# Patient Record
Sex: Male | Born: 1935 | Race: White | Hispanic: No | Marital: Married | Smoking: Former smoker
Health system: Southern US, Community
[De-identification: ages and names within clinical notes are randomized; demographics above are authoritative.]

## PROBLEM LIST (undated history)

## (undated) DIAGNOSIS — E7800 Pure hypercholesterolemia, unspecified: Secondary | ICD-10-CM

## (undated) HISTORY — PX: BUNIONECTOMY: SHX129

## (undated) HISTORY — DX: Pure hypercholesterolemia, unspecified: E78.00

---

## 2012-11-02 ENCOUNTER — Other Ambulatory Visit (HOSPITAL_COMMUNITY): Payer: Self-pay | Admitting: Urology

## 2012-11-02 DIAGNOSIS — D4959 Neoplasm of unspecified behavior of other genitourinary organ: Secondary | ICD-10-CM

## 2012-11-13 ENCOUNTER — Ambulatory Visit (HOSPITAL_COMMUNITY)
Admission: RE | Admit: 2012-11-13 | Discharge: 2012-11-13 | Disposition: A | Discharge status: Home / Self Care | Payer: Medicare Other | Source: Ambulatory Visit | Attending: Urology | Admitting: Urology

## 2012-11-13 DIAGNOSIS — D4959 Neoplasm of unspecified behavior of other genitourinary organ: Secondary | ICD-10-CM

## 2012-11-13 DIAGNOSIS — D2920 Benign neoplasm of unspecified testis: Secondary | ICD-10-CM | POA: Insufficient documentation

## 2012-11-13 DIAGNOSIS — N508 Other specified disorders of male genital organs: Secondary | ICD-10-CM | POA: Insufficient documentation

## 2012-11-13 DIAGNOSIS — N4340 Spermatocele of epididymis, unspecified: Secondary | ICD-10-CM | POA: Insufficient documentation

## 2014-08-29 DIAGNOSIS — Z85828 Personal history of other malignant neoplasm of skin: Secondary | ICD-10-CM | POA: Diagnosis not present

## 2014-08-29 DIAGNOSIS — C44722 Squamous cell carcinoma of skin of right lower limb, including hip: Secondary | ICD-10-CM | POA: Diagnosis not present

## 2014-08-29 DIAGNOSIS — D225 Melanocytic nevi of trunk: Secondary | ICD-10-CM | POA: Diagnosis not present

## 2014-08-29 DIAGNOSIS — Z08 Encounter for follow-up examination after completed treatment for malignant neoplasm: Secondary | ICD-10-CM | POA: Diagnosis not present

## 2014-08-29 DIAGNOSIS — D1801 Hemangioma of skin and subcutaneous tissue: Secondary | ICD-10-CM | POA: Diagnosis not present

## 2014-08-31 DIAGNOSIS — H4020X2 Unspecified primary angle-closure glaucoma, moderate stage: Secondary | ICD-10-CM | POA: Diagnosis not present

## 2014-08-31 DIAGNOSIS — Z961 Presence of intraocular lens: Secondary | ICD-10-CM | POA: Diagnosis not present

## 2015-03-29 DIAGNOSIS — Z23 Encounter for immunization: Secondary | ICD-10-CM | POA: Diagnosis not present

## 2015-03-29 DIAGNOSIS — Z0000 Encounter for general adult medical examination without abnormal findings: Secondary | ICD-10-CM | POA: Diagnosis not present

## 2015-03-29 DIAGNOSIS — E7800 Pure hypercholesterolemia, unspecified: Secondary | ICD-10-CM | POA: Diagnosis not present

## 2015-04-03 DIAGNOSIS — E7800 Pure hypercholesterolemia, unspecified: Secondary | ICD-10-CM | POA: Diagnosis not present

## 2015-04-03 DIAGNOSIS — N400 Enlarged prostate without lower urinary tract symptoms: Secondary | ICD-10-CM | POA: Diagnosis not present

## 2015-04-03 DIAGNOSIS — Z87442 Personal history of urinary calculi: Secondary | ICD-10-CM | POA: Diagnosis not present

## 2015-04-03 DIAGNOSIS — Z86010 Personal history of colonic polyps: Secondary | ICD-10-CM | POA: Diagnosis not present

## 2015-11-08 DIAGNOSIS — Z1283 Encounter for screening for malignant neoplasm of skin: Secondary | ICD-10-CM | POA: Diagnosis not present

## 2015-11-08 DIAGNOSIS — Z08 Encounter for follow-up examination after completed treatment for malignant neoplasm: Secondary | ICD-10-CM | POA: Diagnosis not present

## 2015-11-08 DIAGNOSIS — Z85828 Personal history of other malignant neoplasm of skin: Secondary | ICD-10-CM | POA: Diagnosis not present

## 2015-12-21 DIAGNOSIS — Z961 Presence of intraocular lens: Secondary | ICD-10-CM | POA: Diagnosis not present

## 2015-12-21 DIAGNOSIS — H02402 Unspecified ptosis of left eyelid: Secondary | ICD-10-CM | POA: Diagnosis not present

## 2015-12-21 DIAGNOSIS — H524 Presbyopia: Secondary | ICD-10-CM | POA: Diagnosis not present

## 2016-04-03 DIAGNOSIS — E7800 Pure hypercholesterolemia, unspecified: Secondary | ICD-10-CM | POA: Diagnosis not present

## 2016-04-03 DIAGNOSIS — Z0000 Encounter for general adult medical examination without abnormal findings: Secondary | ICD-10-CM | POA: Diagnosis not present

## 2016-04-09 DIAGNOSIS — Z0001 Encounter for general adult medical examination with abnormal findings: Secondary | ICD-10-CM | POA: Diagnosis not present

## 2016-04-09 DIAGNOSIS — Z1212 Encounter for screening for malignant neoplasm of rectum: Secondary | ICD-10-CM | POA: Diagnosis not present

## 2016-04-09 DIAGNOSIS — Z87442 Personal history of urinary calculi: Secondary | ICD-10-CM | POA: Diagnosis not present

## 2016-04-09 DIAGNOSIS — E7800 Pure hypercholesterolemia, unspecified: Secondary | ICD-10-CM | POA: Diagnosis not present

## 2016-04-09 DIAGNOSIS — E875 Hyperkalemia: Secondary | ICD-10-CM | POA: Diagnosis not present

## 2016-05-28 DIAGNOSIS — T148XXA Other injury of unspecified body region, initial encounter: Secondary | ICD-10-CM | POA: Diagnosis not present

## 2016-08-21 DIAGNOSIS — T17208A Unspecified foreign body in pharynx causing other injury, initial encounter: Secondary | ICD-10-CM | POA: Diagnosis not present

## 2016-08-22 DIAGNOSIS — R1313 Dysphagia, pharyngeal phase: Secondary | ICD-10-CM | POA: Diagnosis not present

## 2016-08-22 DIAGNOSIS — J029 Acute pharyngitis, unspecified: Secondary | ICD-10-CM | POA: Diagnosis not present

## 2016-08-28 DIAGNOSIS — L570 Actinic keratosis: Secondary | ICD-10-CM | POA: Diagnosis not present

## 2016-08-28 DIAGNOSIS — Z08 Encounter for follow-up examination after completed treatment for malignant neoplasm: Secondary | ICD-10-CM | POA: Diagnosis not present

## 2016-08-28 DIAGNOSIS — L820 Inflamed seborrheic keratosis: Secondary | ICD-10-CM | POA: Diagnosis not present

## 2016-08-28 DIAGNOSIS — Z85828 Personal history of other malignant neoplasm of skin: Secondary | ICD-10-CM | POA: Diagnosis not present

## 2016-08-28 DIAGNOSIS — X32XXXD Exposure to sunlight, subsequent encounter: Secondary | ICD-10-CM | POA: Diagnosis not present

## 2016-08-28 DIAGNOSIS — Z1283 Encounter for screening for malignant neoplasm of skin: Secondary | ICD-10-CM | POA: Diagnosis not present

## 2016-10-08 DIAGNOSIS — L237 Allergic contact dermatitis due to plants, except food: Secondary | ICD-10-CM | POA: Diagnosis not present

## 2016-10-08 DIAGNOSIS — L299 Pruritus, unspecified: Secondary | ICD-10-CM | POA: Diagnosis not present

## 2016-10-08 DIAGNOSIS — R21 Rash and other nonspecific skin eruption: Secondary | ICD-10-CM | POA: Diagnosis not present

## 2016-11-04 DIAGNOSIS — S51812A Laceration without foreign body of left forearm, initial encounter: Secondary | ICD-10-CM | POA: Diagnosis not present

## 2017-05-26 DIAGNOSIS — H524 Presbyopia: Secondary | ICD-10-CM | POA: Diagnosis not present

## 2017-05-26 DIAGNOSIS — H4020X2 Unspecified primary angle-closure glaucoma, moderate stage: Secondary | ICD-10-CM | POA: Diagnosis not present

## 2017-05-26 DIAGNOSIS — Z961 Presence of intraocular lens: Secondary | ICD-10-CM | POA: Diagnosis not present

## 2017-05-28 DIAGNOSIS — E7800 Pure hypercholesterolemia, unspecified: Secondary | ICD-10-CM | POA: Diagnosis not present

## 2017-05-28 DIAGNOSIS — Z0000 Encounter for general adult medical examination without abnormal findings: Secondary | ICD-10-CM | POA: Diagnosis not present

## 2017-06-02 DIAGNOSIS — M8580 Other specified disorders of bone density and structure, unspecified site: Secondary | ICD-10-CM | POA: Diagnosis not present

## 2017-06-02 DIAGNOSIS — M40204 Unspecified kyphosis, thoracic region: Secondary | ICD-10-CM | POA: Diagnosis not present

## 2017-06-02 DIAGNOSIS — M47814 Spondylosis without myelopathy or radiculopathy, thoracic region: Secondary | ICD-10-CM | POA: Diagnosis not present

## 2017-06-02 DIAGNOSIS — Z0001 Encounter for general adult medical examination with abnormal findings: Secondary | ICD-10-CM | POA: Diagnosis not present

## 2017-06-02 DIAGNOSIS — M859 Disorder of bone density and structure, unspecified: Secondary | ICD-10-CM | POA: Diagnosis not present

## 2017-06-23 DIAGNOSIS — X32XXXD Exposure to sunlight, subsequent encounter: Secondary | ICD-10-CM | POA: Diagnosis not present

## 2017-06-23 DIAGNOSIS — L570 Actinic keratosis: Secondary | ICD-10-CM | POA: Diagnosis not present

## 2017-06-23 DIAGNOSIS — Z85828 Personal history of other malignant neoplasm of skin: Secondary | ICD-10-CM | POA: Diagnosis not present

## 2017-06-23 DIAGNOSIS — D225 Melanocytic nevi of trunk: Secondary | ICD-10-CM | POA: Diagnosis not present

## 2017-06-23 DIAGNOSIS — C44212 Basal cell carcinoma of skin of right ear and external auricular canal: Secondary | ICD-10-CM | POA: Diagnosis not present

## 2017-06-23 DIAGNOSIS — Z08 Encounter for follow-up examination after completed treatment for malignant neoplasm: Secondary | ICD-10-CM | POA: Diagnosis not present

## 2017-07-14 DIAGNOSIS — C44212 Basal cell carcinoma of skin of right ear and external auricular canal: Secondary | ICD-10-CM | POA: Diagnosis not present

## 2017-08-04 DIAGNOSIS — C44212 Basal cell carcinoma of skin of right ear and external auricular canal: Secondary | ICD-10-CM | POA: Diagnosis not present

## 2018-03-31 DIAGNOSIS — Z1212 Encounter for screening for malignant neoplasm of rectum: Secondary | ICD-10-CM | POA: Diagnosis not present

## 2018-03-31 DIAGNOSIS — Z1211 Encounter for screening for malignant neoplasm of colon: Secondary | ICD-10-CM | POA: Diagnosis not present

## 2018-05-05 DIAGNOSIS — M7071 Other bursitis of hip, right hip: Secondary | ICD-10-CM | POA: Diagnosis not present

## 2018-05-29 DIAGNOSIS — H52203 Unspecified astigmatism, bilateral: Secondary | ICD-10-CM | POA: Diagnosis not present

## 2018-05-29 DIAGNOSIS — Z961 Presence of intraocular lens: Secondary | ICD-10-CM | POA: Diagnosis not present

## 2018-05-29 DIAGNOSIS — H4020X2 Unspecified primary angle-closure glaucoma, moderate stage: Secondary | ICD-10-CM | POA: Diagnosis not present

## 2018-06-02 DIAGNOSIS — D485 Neoplasm of uncertain behavior of skin: Secondary | ICD-10-CM | POA: Diagnosis not present

## 2018-06-02 DIAGNOSIS — Z1283 Encounter for screening for malignant neoplasm of skin: Secondary | ICD-10-CM | POA: Diagnosis not present

## 2018-06-02 DIAGNOSIS — D3611 Benign neoplasm of peripheral nerves and autonomic nervous system of face, head, and neck: Secondary | ICD-10-CM | POA: Diagnosis not present

## 2018-06-02 DIAGNOSIS — L821 Other seborrheic keratosis: Secondary | ICD-10-CM | POA: Diagnosis not present

## 2018-06-02 DIAGNOSIS — L905 Scar conditions and fibrosis of skin: Secondary | ICD-10-CM | POA: Diagnosis not present

## 2018-06-02 DIAGNOSIS — L219 Seborrheic dermatitis, unspecified: Secondary | ICD-10-CM | POA: Diagnosis not present

## 2018-06-02 DIAGNOSIS — L570 Actinic keratosis: Secondary | ICD-10-CM | POA: Diagnosis not present

## 2018-06-08 DIAGNOSIS — E7800 Pure hypercholesterolemia, unspecified: Secondary | ICD-10-CM | POA: Diagnosis not present

## 2018-06-11 DIAGNOSIS — H00019 Hordeolum externum unspecified eye, unspecified eyelid: Secondary | ICD-10-CM | POA: Diagnosis not present

## 2018-06-11 DIAGNOSIS — L719 Rosacea, unspecified: Secondary | ICD-10-CM | POA: Diagnosis not present

## 2018-06-11 DIAGNOSIS — Z0001 Encounter for general adult medical examination with abnormal findings: Secondary | ICD-10-CM | POA: Diagnosis not present

## 2018-06-11 DIAGNOSIS — E7800 Pure hypercholesterolemia, unspecified: Secondary | ICD-10-CM | POA: Diagnosis not present

## 2018-07-15 ENCOUNTER — Encounter: Payer: Self-pay | Admitting: Urology

## 2018-07-15 ENCOUNTER — Ambulatory Visit: Payer: Medicare Other | Admitting: Urology

## 2018-07-15 VITALS — BP 143/73 | HR 64 | Ht 67.0 in | Wt 147.0 lb

## 2018-07-15 DIAGNOSIS — N5201 Erectile dysfunction due to arterial insufficiency: Secondary | ICD-10-CM | POA: Diagnosis not present

## 2018-07-15 NOTE — Progress Notes (Signed)
07/15/2018 10:08 AM   Noland Fordyce [birth year] [id number]  Referring provider: Deland Pretty, MD [address] [place], [place] [address]  Chief Complaint  Patient presents with  . Erectile Dysfunction    HPI: Jason Mendez is an 85 year old male seen in consultation at the request of Dr. Shelia Media for evaluation of erectile dysfunction.  He presents with a 2-year history of erectile dysfunction.  He has partial erections which are firm enough for penetration much less than 50% of the time.  He is rarely ever able to maintain the erection after penetration and states intercourse is almost never satisfactory.  SHIM score was 725 indicating severe ED.  Denies pain or curvature with erections.  He has tried both tadalafil and sildenafil without improvement.  He had a testosterone level checked which was normal.   PMH: Past Medical History:  Diagnosis Date  . High cholesterol     Surgical History: Past Surgical History:  Procedure Laterality Date  . BUNIONECTOMY      Home Medications:  Allergies as of 07/15/2018   No Known Allergies     Medication List       Accurate as of July 15, 2018 10:08 AM. Always use your most recent med list.        atorvastatin 20 MG tablet Commonly known as:  LIPITOR Take 20 mg by mouth daily.   multivitamin tablet Take 1 tablet by mouth daily.       Allergies: No Known Allergies  Family History: History reviewed. No pertinent family history.  Social History:  reports that he has never smoked. He has never used smokeless tobacco. He reports current alcohol use. He reports that he does not use drugs.  ROS: UROLOGY Frequent Urination?: No Hard to postpone urination?: No Burning/pain with urination?: No Get up at night to urinate?: No Leakage of urine?: No Urine stream starts and stops?: No Trouble starting stream?: No Do you have to strain to urinate?: No Blood in urine?: No Urinary tract infection?: No Sexually  transmitted disease?: No Injury to kidneys or bladder?: No Painful intercourse?: No Weak stream?: No Erection problems?: Yes Penile pain?: No  Gastrointestinal Nausea?: No Vomiting?: No Indigestion/heartburn?: No Diarrhea?: No Constipation?: No  Constitutional Fever: No Night sweats?: No Weight loss?: No Fatigue?: No  Skin Skin rash/lesions?: No Itching?: No  Eyes Blurred vision?: No Double vision?: No  Ears/Nose/Throat Sore throat?: No Sinus problems?: No  Hematologic/Lymphatic Swollen glands?: No Easy bruising?: No  Cardiovascular Leg swelling?: No Chest pain?: No  Respiratory Cough?: No Shortness of breath?: No  Endocrine Excessive thirst?: No   Physical Exam: BP (!) 143/73   Pulse 64   Ht 5\' 7"  (1.702 m)   Wt 147 lb (66.7 kg)   BMI 23.02 kg/m   Constitutional:  Alert and oriented, No acute distress. HEENT: [place] AT, moist mucus membranes.  Trachea midline, no masses. Cardiovascular: No clubbing, cyanosis, or edema. Respiratory: Normal respiratory effort, no increased work of breathing. Neurologic: Grossly intact, no focal deficits, moving all 4 extremities. Psychiatric: Normal mood and affect.   Assessment & Plan:   85 year old male with erectile dysfunction who has failed PDE 5 inhibitor therapy.  I discussed other available options including vacuum erection devices, intracavernosal injections, shockwave therapy and penile implant surgery.  He was given literature on vacuum devices and intracavernosal injections.  At this time he is not interested in pursuing other options and will call back if he changes his mind.   C ,  MD  [facility] [address] [place], [place] [address] [phone]

## 2019-01-11 DIAGNOSIS — D225 Melanocytic nevi of trunk: Secondary | ICD-10-CM | POA: Diagnosis not present

## 2019-01-11 DIAGNOSIS — Z85828 Personal history of other malignant neoplasm of skin: Secondary | ICD-10-CM | POA: Diagnosis not present

## 2019-01-11 DIAGNOSIS — B078 Other viral warts: Secondary | ICD-10-CM | POA: Diagnosis not present

## 2019-01-11 DIAGNOSIS — Z1283 Encounter for screening for malignant neoplasm of skin: Secondary | ICD-10-CM | POA: Diagnosis not present

## 2019-01-11 DIAGNOSIS — Z08 Encounter for follow-up examination after completed treatment for malignant neoplasm: Secondary | ICD-10-CM | POA: Diagnosis not present

## 2019-01-11 DIAGNOSIS — L570 Actinic keratosis: Secondary | ICD-10-CM | POA: Diagnosis not present

## 2019-02-15 DIAGNOSIS — J301 Allergic rhinitis due to pollen: Secondary | ICD-10-CM | POA: Diagnosis not present

## 2019-02-15 DIAGNOSIS — K219 Gastro-esophageal reflux disease without esophagitis: Secondary | ICD-10-CM | POA: Diagnosis not present

## 2019-02-15 DIAGNOSIS — R490 Dysphonia: Secondary | ICD-10-CM | POA: Diagnosis not present

## 2019-03-16 ENCOUNTER — Encounter (INDEPENDENT_AMBULATORY_CARE_PROVIDER_SITE_OTHER): Payer: Self-pay

## 2019-03-29 DIAGNOSIS — K219 Gastro-esophageal reflux disease without esophagitis: Secondary | ICD-10-CM | POA: Diagnosis not present

## 2019-03-29 DIAGNOSIS — R490 Dysphonia: Secondary | ICD-10-CM | POA: Diagnosis not present

## 2019-05-24 DIAGNOSIS — H0100A Unspecified blepharitis right eye, upper and lower eyelids: Secondary | ICD-10-CM | POA: Diagnosis not present

## 2019-05-24 DIAGNOSIS — H1031 Unspecified acute conjunctivitis, right eye: Secondary | ICD-10-CM | POA: Diagnosis not present

## 2019-05-24 DIAGNOSIS — H0100B Unspecified blepharitis left eye, upper and lower eyelids: Secondary | ICD-10-CM | POA: Diagnosis not present

## 2019-05-24 DIAGNOSIS — H11121 Conjunctival concretions, right eye: Secondary | ICD-10-CM | POA: Diagnosis not present

## 2019-05-31 ENCOUNTER — Ambulatory Visit
Admission: EM | Admit: 2019-05-31 | Discharge: 2019-05-31 | Disposition: A | Discharge status: Home / Self Care | Payer: Medicare Other

## 2019-05-31 ENCOUNTER — Other Ambulatory Visit: Payer: Self-pay

## 2019-05-31 ENCOUNTER — Encounter: Payer: Self-pay | Admitting: Emergency Medicine

## 2019-05-31 DIAGNOSIS — S61209A Unspecified open wound of unspecified finger without damage to nail, initial encounter: Secondary | ICD-10-CM | POA: Diagnosis not present

## 2019-05-31 DIAGNOSIS — Z23 Encounter for immunization: Secondary | ICD-10-CM

## 2019-05-31 DIAGNOSIS — W271XXA Contact with garden tool, initial encounter: Secondary | ICD-10-CM

## 2019-05-31 MED ORDER — TETANUS-DIPHTH-ACELL PERTUSSIS 5-2.5-18.5 LF-MCG/0.5 IM SUSP
0.5000 mL | Freq: Once | INTRAMUSCULAR | Status: AC
  Administered 2019-05-31: 0.5 mL via INTRAMUSCULAR

## 2019-05-31 MED ORDER — CEPHALEXIN 500 MG PO CAPS: 500.0000 mg | ORAL_CAPSULE | Freq: Three times a day (TID) | ORAL | 0 refills | Status: AC

## 2019-05-31 NOTE — ED Provider Notes (Signed)
Roderic Palau    CSN: [id number] Arrival date & time: 05/31/19  1705      History   Chief Complaint Chief Complaint  Patient presents with  . Finger Injury    DOI 05/31/19    HPI NORI KUNSELMAN is a 86 y.o. male.   Patient presents with injury to his left ring finger; He cut the tip off while using hedge clippers at home today.  He has attempted to control the bleeding at home with pressure and a bandage but states it is still bleeding and seeping through the bandage.  He denies other injury.  He denies weakness or numbness.  Last tetanus > 5 years.    The history is provided by the patient.    Past Medical History:  Diagnosis Date  . High cholesterol     There are no problems to display for this patient.   Past Surgical History:  Procedure Laterality Date  . BUNIONECTOMY         Home Medications    Prior to Admission medications   Medication Sig Start Date End Date Taking? Authorizing Provider  atorvastatin (LIPITOR) 20 MG tablet Take 20 mg by mouth daily.   Yes [provider]  famotidine (PEPCID) 20 MG tablet Take 20 mg by mouth daily.   Yes [provider]  Multiple Vitamin (MULTIVITAMIN) tablet Take 1 tablet by mouth daily.   Yes [provider]  cephALEXin (KEFLEX) 500 MG capsule Take 1 capsule (500 mg total) by mouth 3 (three) times daily for 5 days. 05/31/19 06/05/19  Sharion Balloon, NP    Family History Family History  Problem Relation Age of Onset  . Rheum arthritis Mother   . Osteoarthritis Mother   . Stroke Father   . Diabetes Father   . Hypertension Father     Social History Social History   Tobacco Use  . Smoking status: Former Smoker    Quit date: 05/30/1974    Years since quitting: 45.0  . Smokeless tobacco: Never Used  Substance Use Topics  . Alcohol use: Yes    Alcohol/week: 14.0 standard drinks    Types: 14 Glasses of wine per week    Comment: 2 drinks daiy wine or scotch  . Drug use: Never      Allergies   Patient has no known allergies.   Review of Systems Review of Systems  Constitutional: Negative for chills and fever.  HENT: Negative for ear pain and sore throat.   Eyes: Negative for pain and visual disturbance.  Respiratory: Negative for cough and shortness of breath.   Cardiovascular: Negative for chest pain and palpitations.  Gastrointestinal: Negative for abdominal pain and vomiting.  Genitourinary: Negative for dysuria and hematuria.  Musculoskeletal: Negative for arthralgias and back pain.  Skin: Positive for wound. Negative for color change and rash.  Neurological: Negative for seizures, syncope, weakness and numbness.  All other systems reviewed and are negative.    Physical Exam Triage Vital Signs ED Triage Vitals  Enc Vitals Group     BP      Pulse      Resp      Temp      Temp src      SpO2      Weight      Height      Head Circumference      Peak Flow      Pain Score      Pain Loc  Pain Edu?      Excl. in [place]?    No data found.  Updated Vital Signs BP 129/71 (BP Location: Right Arm)   Pulse 66   Temp 98.2 F (36.8 C) (Oral)   Resp 18   Ht 5' 9.5" (1.765 m)   Wt 151 lb (68.5 kg)   SpO2 97%   BMI 21.98 kg/m   Visual Acuity Right Eye Distance:   Left Eye Distance:   Bilateral Distance:    Right Eye Near:   Left Eye Near:    Bilateral Near:     Physical Exam Vitals and nursing note reviewed.  Constitutional:      General: He is not in acute distress.    Appearance: He is well-developed.  HENT:     Head: Normocephalic and atraumatic.     Mouth/Throat:     Mouth: Mucous membranes are moist.     Pharynx: Oropharynx is clear.  Eyes:     Conjunctiva/sclera: Conjunctivae normal.  Cardiovascular:     Rate and Rhythm: Normal rate and regular rhythm.     Heart sounds: No murmur.  Pulmonary:     Effort: Pulmonary effort is normal. No respiratory distress.     Breath sounds: Normal breath sounds.  Abdominal:      Palpations: Abdomen is soft.     Tenderness: There is no abdominal tenderness. There is no guarding or rebound.  Musculoskeletal:        General: No deformity. Normal range of motion.     Cervical back: Neck supple.  Skin:    General: Skin is warm and dry.     Capillary Refill: Capillary refill takes less than 2 seconds.     Comments: Left 4th finger: tip avulsed; actively bleeding; sensation intact; FROM; 2+ cap refill; fingernail not involved.     Neurological:     General: No focal deficit present.     Mental Status: He is alert and oriented to person, place, and time.     Sensory: No sensory deficit.     Motor: No weakness.  Psychiatric:        Mood and Affect: Mood normal.        Behavior: Behavior normal.      UC Treatments / Results  Labs (all labs ordered are listed, but only abnormal results are displayed) Labs Reviewed - No data to display  EKG   Radiology No results found.  Procedures Laceration Repair  Date/Time: 05/31/2019 6:08 PM Performed by: Sharion Balloon, NP Authorized by: Sharion Balloon, NP   Consent:    Consent obtained:  Verbal   Consent given by:  Patient Anesthesia (see MAR for exact dosages):    Anesthesia method:  Nerve block   Block location:  Left 4th finger   Block anesthetic:  Lidocaine 2% w/o epi   Block injection procedure:  Anatomic landmarks identified, introduced needle, incremental injection, anatomic landmarks palpated and negative aspiration for blood   Block outcome:  Anesthesia achieved Laceration details:    Location:  Finger   Finger location:  L ring finger Exploration:    Hemostasis achieved with:  Cautery and direct pressure   Wound exploration: entire depth of wound probed and visualized     Contaminated: no   Treatment:    Area cleansed with:  Shur-Clens Post-procedure details:    Dressing:  Antibiotic ointment and non-adherent dressing   Patient tolerance of procedure:  Tolerated well, no immediate complications  Comments:  Bleeding stopped with silver nitrate applicators.     (including critical care time)  Medications Ordered in UC Medications  Tdap (BOOSTRIX) injection 0.5 mL (0.5 mLs Intramuscular Given 05/31/19 1750)    Initial Impression / Assessment and Plan / UC Course  I have reviewed the triage vital signs and the nursing notes.  Pertinent labs & imaging results that were available during my care of the patient were reviewed by me and considered in my medical decision making (see chart for details).   Avulsion of left 4th finger tip.  Silver nitrate applicators used to stop bleeding.  Tetanus updated.  Keflex prescribed to prevent infection.  Wound care instructions and signs of infection discussed at length with patient and his wife.  Instructed him to return here if he notes signs of infection.  States he has tramadol at home to control his pain; instructed him to contact us if his pain is not controlled.  Patient agrees to plan of care.     Final Clinical Impressions(s) / UC Diagnoses   Final diagnoses:  Avulsion of finger tip, initial encounter     Discharge Instructions     You were given a tetanus shot today.    Take the antibiotic as directed.    Keep your wound clean and dry.  Wash it gently twice a day with soap and water.  Apply an antibiotic cream twice a day.    Return here if you see signs of infection, such as increased pain, redness, pus-like drainage, warmth, fever, chills, or other concerning symptoms.         ED Prescriptions    Medication Sig Dispense Auth. Provider   cephALEXin (KEFLEX) 500 MG capsule Take 1 capsule (500 mg total) by mouth 3 (three) times daily for 5 days. 15 capsule Sharion Balloon, NP     PDMP not reviewed this encounter.   Sharion Balloon, NP 05/31/19 [phone]

## 2019-05-31 NOTE — Discharge Instructions (Addendum)
You were given a tetanus shot today.    Take the antibiotic as directed.    Keep your wound clean and dry.  Wash it gently twice a day with soap and water.  Apply an antibiotic cream twice a day.    Return here if you see signs of infection, such as increased pain, redness, pus-like drainage, warmth, fever, chills, or other concerning symptoms.

## 2019-05-31 NOTE — ED Triage Notes (Signed)
Patient in today after injuring his left ring finger with hedge clippers earlier today. Patient states last tetanus shot was ~9-10 years ago.

## 2019-06-03 ENCOUNTER — Ambulatory Visit: Payer: Self-pay | Admitting: Nurse Practitioner

## 2019-06-04 DIAGNOSIS — Z961 Presence of intraocular lens: Secondary | ICD-10-CM | POA: Diagnosis not present

## 2019-06-04 DIAGNOSIS — H5212 Myopia, left eye: Secondary | ICD-10-CM | POA: Diagnosis not present

## 2019-06-04 DIAGNOSIS — H40032 Anatomical narrow angle, left eye: Secondary | ICD-10-CM | POA: Diagnosis not present

## 2019-06-17 DIAGNOSIS — E7800 Pure hypercholesterolemia, unspecified: Secondary | ICD-10-CM | POA: Diagnosis not present

## 2019-06-22 DIAGNOSIS — Z87891 Personal history of nicotine dependence: Secondary | ICD-10-CM | POA: Diagnosis not present

## 2019-06-22 DIAGNOSIS — Z0001 Encounter for general adult medical examination with abnormal findings: Secondary | ICD-10-CM | POA: Diagnosis not present

## 2019-06-28 DIAGNOSIS — H01004 Unspecified blepharitis left upper eyelid: Secondary | ICD-10-CM | POA: Diagnosis not present

## 2019-06-28 DIAGNOSIS — H01001 Unspecified blepharitis right upper eyelid: Secondary | ICD-10-CM | POA: Diagnosis not present

## 2019-07-01 DIAGNOSIS — I781 Nevus, non-neoplastic: Secondary | ICD-10-CM | POA: Diagnosis not present

## 2019-07-01 DIAGNOSIS — L821 Other seborrheic keratosis: Secondary | ICD-10-CM | POA: Diagnosis not present

## 2019-07-29 DIAGNOSIS — I7389 Other specified peripheral vascular diseases: Secondary | ICD-10-CM | POA: Diagnosis not present

## 2019-07-29 DIAGNOSIS — R0989 Other specified symptoms and signs involving the circulatory and respiratory systems: Secondary | ICD-10-CM | POA: Diagnosis not present

## 2019-10-19 ENCOUNTER — Other Ambulatory Visit: Payer: Self-pay

## 2019-10-19 ENCOUNTER — Ambulatory Visit: Payer: Medicare Other | Admitting: Nurse Practitioner

## 2019-10-19 ENCOUNTER — Encounter: Payer: Self-pay | Admitting: Nurse Practitioner

## 2019-10-19 VITALS — BP 120/70 | HR 58 | Temp 97.4°F | Ht 69.0 in | Wt 159.0 lb

## 2019-10-19 DIAGNOSIS — R600 Localized edema: Secondary | ICD-10-CM | POA: Diagnosis not present

## 2019-10-19 NOTE — Progress Notes (Signed)
Careteam: Patient Care Team: Deland Pretty, MD as PCP - General (Internal Medicine)  Advanced Directive information    No Known Allergies  Chief Complaint  Patient presents with  . Acute Visit    Swelling in right leg for about 1 week     HPI: Patient is a 86 y.o. male seen in today at the [facility] for swelling in leg.  Last week he was doing yard work, trimming tree and crawled around  with knee pads. The next morning got up and when he got out of the chair "knee caught" wore a knee brace for a few days and things improved.  3 days later he walked to a meeting across campus and back- 1/4 mile round trip.  That night right leg was very tight, swelling went down over night but then the next day had a party for his wifes 90th birthday and were busy preparing for that. Leg would swell during the day but improve at night but never resolved;  Attempted to call PCP but could not get in.  Saw [place] nurse yesterday and she advised him to elevate leg and now it is the smallest it has been.  No pain, no redness or heat.  .   Had [place] to Lower legs recently at PCP office to evaluate redness to right foot. No acute findings- do not have access to this report- not in epic.   Review of Systems:  Review of Systems  Constitutional: Negative for chills, fever, malaise/fatigue and weight loss.  Respiratory: Negative for cough and shortness of breath.   Cardiovascular: Positive for leg swelling (no redness, pain, warmth). Negative for chest pain and palpitations.  Musculoskeletal: Negative for myalgias.    Past Medical History:  Diagnosis Date  . High cholesterol    Past Surgical History:  Procedure Laterality Date  . BUNIONECTOMY     Social History:   reports that he quit smoking about 45 years ago. He has never used smokeless tobacco. He reports current alcohol use of about 14.0 standard drinks of alcohol per week. He reports that he does not use drugs.  Family History  Problem  Relation Age of Onset  . Rheum arthritis Mother   . Osteoarthritis Mother   . Stroke Father   . Diabetes Father   . Hypertension Father     Medications: Patient's Medications  New Prescriptions   No medications on file  Previous Medications   ALENDRONATE (FOSAMAX) 70 MG TABLET    Take 70 mg by mouth once a week.   ATORVASTATIN (LIPITOR) 20 MG TABLET    Take 20 mg by mouth daily.   FAMOTIDINE (PEPCID) 20 MG TABLET    Take 20 mg by mouth daily.   MULTIPLE VITAMIN (MULTIVITAMIN) TABLET    Take 1 tablet by mouth daily.  Modified Medications   No medications on file  Discontinued Medications   No medications on file    Physical Exam:  Vitals:   10/19/19 0943  BP: 120/70  Pulse: (!) 58  Temp: (!) 97.4 F (36.3 C)  TempSrc: Temporal  SpO2: 100%  Weight: 159 lb (72.1 kg)  Height: 5\' 9"  (1.753 m)   Body mass index is 23.48 kg/m. Wt Readings from Last 3 Encounters:  10/19/19 159 lb (72.1 kg)  05/31/19 151 lb (68.5 kg)  07/15/18 147 lb (66.7 kg)    Physical Exam Constitutional:      Appearance: Normal appearance.  HENT:     Head: Normocephalic  and atraumatic.  Eyes:     Pupils: Pupils are equal, round, and reactive to light.  Musculoskeletal:        General: No tenderness, deformity or signs of injury.     Right knee: No effusion, erythema or ecchymosis.     Left knee: No effusion, erythema or ecchymosis.     Right lower leg: 1+ Edema (trace) present.     Left lower leg: No edema.     Comments: Fullness noted behind right knee, no redness or tenderness noted.   Skin:    General: Skin is warm and dry.     Findings: No erythema, lesion or rash.  Neurological:     Mental Status: He is alert and oriented to person, place, and time.  Psychiatric:        Mood and Affect: Mood normal.        Behavior: Behavior normal.     Labs reviewed: Basic Metabolic Panel: No results for input(s): NA, K, CL, CO2, GLUCOSE, BUN, CREATININE, CALCIUM, MG, PHOS, TSH in the last 8760  hours. Liver Function Tests: No results for input(s): AST, ALT, ALKPHOS, BILITOT, PROT, ALBUMIN in the last 8760 hours. No results for input(s): LIPASE, AMYLASE in the last 8760 hours. No results for input(s): AMMONIA in the last 8760 hours. CBC: No results for input(s): WBC, NEUTROABS, HGB, HCT, MCV, PLT in the last 8760 hours. Lipid Panel: No results for input(s): CHOL, HDL, LDLCALC, TRIG, CHOLHDL, LDLDIRECT in the last 8760 hours. TSH: No results for input(s): TSH in the last 8760 hours. A1C: No results found for: HGBA1C   Assessment/Plan 1. Lower extremity edema Overall improved, increase in edema happened after increase in physical activity and being on his knees. Also based on exam suspect right popliteal cyst which could be contributing to edema of right lower leg.  reports he recently had ultrasound with PCP and plans to follow up with him as well. Continues to elevate lower extremities which has been helping significantly. No pain, heat, warmth. Aware he will need immediate attention for this or if swelling worsens.    Carlos American. [place], [facility] [phone]

## 2019-10-26 DIAGNOSIS — S86911A Strain of unspecified muscle(s) and tendon(s) at lower leg level, right leg, initial encounter: Secondary | ICD-10-CM | POA: Diagnosis not present

## 2019-10-26 DIAGNOSIS — S86912A Strain of unspecified muscle(s) and tendon(s) at lower leg level, left leg, initial encounter: Secondary | ICD-10-CM | POA: Diagnosis not present

## 2020-02-04 DIAGNOSIS — H10413 Chronic giant papillary conjunctivitis, bilateral: Secondary | ICD-10-CM | POA: Diagnosis not present

## 2020-02-04 DIAGNOSIS — H0100A Unspecified blepharitis right eye, upper and lower eyelids: Secondary | ICD-10-CM | POA: Diagnosis not present

## 2020-02-04 DIAGNOSIS — H0100B Unspecified blepharitis left eye, upper and lower eyelids: Secondary | ICD-10-CM | POA: Diagnosis not present

## 2020-02-10 DIAGNOSIS — H02105 Unspecified ectropion of left lower eyelid: Secondary | ICD-10-CM | POA: Diagnosis not present

## 2020-02-10 DIAGNOSIS — D485 Neoplasm of uncertain behavior of skin: Secondary | ICD-10-CM | POA: Diagnosis not present

## 2020-02-10 DIAGNOSIS — H02102 Unspecified ectropion of right lower eyelid: Secondary | ICD-10-CM | POA: Diagnosis not present

## 2020-02-10 DIAGNOSIS — H1131 Conjunctival hemorrhage, right eye: Secondary | ICD-10-CM | POA: Diagnosis not present

## 2020-05-29 DIAGNOSIS — L814 Other melanin hyperpigmentation: Secondary | ICD-10-CM | POA: Diagnosis not present

## 2020-05-29 DIAGNOSIS — Z1283 Encounter for screening for malignant neoplasm of skin: Secondary | ICD-10-CM | POA: Diagnosis not present

## 2020-05-29 DIAGNOSIS — L821 Other seborrheic keratosis: Secondary | ICD-10-CM | POA: Diagnosis not present

## 2020-05-29 DIAGNOSIS — D225 Melanocytic nevi of trunk: Secondary | ICD-10-CM | POA: Diagnosis not present

## 2020-05-29 DIAGNOSIS — B078 Other viral warts: Secondary | ICD-10-CM | POA: Diagnosis not present

## 2020-05-29 DIAGNOSIS — L820 Inflamed seborrheic keratosis: Secondary | ICD-10-CM | POA: Diagnosis not present

## 2020-05-29 DIAGNOSIS — D3612 Benign neoplasm of peripheral nerves and autonomic nervous system, upper limb, including shoulder: Secondary | ICD-10-CM | POA: Diagnosis not present

## 2020-05-29 DIAGNOSIS — X32XXXD Exposure to sunlight, subsequent encounter: Secondary | ICD-10-CM | POA: Diagnosis not present

## 2020-05-29 DIAGNOSIS — L570 Actinic keratosis: Secondary | ICD-10-CM | POA: Diagnosis not present

## 2020-05-29 DIAGNOSIS — D2362 Other benign neoplasm of skin of left upper limb, including shoulder: Secondary | ICD-10-CM | POA: Diagnosis not present

## 2020-06-07 DIAGNOSIS — Z961 Presence of intraocular lens: Secondary | ICD-10-CM | POA: Diagnosis not present

## 2020-06-07 DIAGNOSIS — H0100A Unspecified blepharitis right eye, upper and lower eyelids: Secondary | ICD-10-CM | POA: Diagnosis not present

## 2020-06-07 DIAGNOSIS — H524 Presbyopia: Secondary | ICD-10-CM | POA: Diagnosis not present

## 2020-06-17 DIAGNOSIS — E7800 Pure hypercholesterolemia, unspecified: Secondary | ICD-10-CM | POA: Diagnosis not present

## 2020-06-17 DIAGNOSIS — M810 Age-related osteoporosis without current pathological fracture: Secondary | ICD-10-CM | POA: Diagnosis not present

## 2020-06-20 DIAGNOSIS — Z0000 Encounter for general adult medical examination without abnormal findings: Secondary | ICD-10-CM | POA: Diagnosis not present

## 2020-06-20 DIAGNOSIS — R238 Other skin changes: Secondary | ICD-10-CM | POA: Diagnosis not present

## 2020-06-20 DIAGNOSIS — E7800 Pure hypercholesterolemia, unspecified: Secondary | ICD-10-CM | POA: Diagnosis not present

## 2020-06-26 DIAGNOSIS — Z0001 Encounter for general adult medical examination with abnormal findings: Secondary | ICD-10-CM | POA: Diagnosis not present

## 2020-06-26 DIAGNOSIS — E7800 Pure hypercholesterolemia, unspecified: Secondary | ICD-10-CM | POA: Diagnosis not present

## 2020-06-30 ENCOUNTER — Other Ambulatory Visit: Payer: Self-pay | Admitting: Internal Medicine

## 2020-06-30 DIAGNOSIS — E7800 Pure hypercholesterolemia, unspecified: Secondary | ICD-10-CM

## 2020-08-17 DIAGNOSIS — E7800 Pure hypercholesterolemia, unspecified: Secondary | ICD-10-CM | POA: Diagnosis not present

## 2020-08-17 DIAGNOSIS — M810 Age-related osteoporosis without current pathological fracture: Secondary | ICD-10-CM | POA: Diagnosis not present

## 2020-09-06 DIAGNOSIS — M85851 Other specified disorders of bone density and structure, right thigh: Secondary | ICD-10-CM | POA: Diagnosis not present

## 2020-09-06 DIAGNOSIS — Z0000 Encounter for general adult medical examination without abnormal findings: Secondary | ICD-10-CM | POA: Diagnosis not present

## 2020-09-06 DIAGNOSIS — M810 Age-related osteoporosis without current pathological fracture: Secondary | ICD-10-CM | POA: Diagnosis not present

## 2020-09-06 DIAGNOSIS — E7800 Pure hypercholesterolemia, unspecified: Secondary | ICD-10-CM | POA: Diagnosis not present

## 2020-09-06 DIAGNOSIS — M85852 Other specified disorders of bone density and structure, left thigh: Secondary | ICD-10-CM | POA: Diagnosis not present

## 2020-09-06 DIAGNOSIS — M8589 Other specified disorders of bone density and structure, multiple sites: Secondary | ICD-10-CM | POA: Diagnosis not present

## 2020-09-16 DIAGNOSIS — E7800 Pure hypercholesterolemia, unspecified: Secondary | ICD-10-CM | POA: Diagnosis not present

## 2020-09-16 DIAGNOSIS — M180 Bilateral primary osteoarthritis of first carpometacarpal joints: Secondary | ICD-10-CM | POA: Diagnosis not present

## 2020-10-06 ENCOUNTER — Ambulatory Visit
Admission: RE | Admit: 2020-10-06 | Discharge: 2020-10-06 | Disposition: A | Discharge status: Home / Self Care | Payer: No Typology Code available for payment source | Source: Ambulatory Visit | Attending: Internal Medicine | Admitting: Internal Medicine

## 2020-10-06 DIAGNOSIS — E7800 Pure hypercholesterolemia, unspecified: Secondary | ICD-10-CM

## 2020-10-06 DIAGNOSIS — E785 Hyperlipidemia, unspecified: Secondary | ICD-10-CM | POA: Diagnosis not present

## 2021-01-01 DIAGNOSIS — D2261 Melanocytic nevi of right upper limb, including shoulder: Secondary | ICD-10-CM | POA: Diagnosis not present

## 2021-01-01 DIAGNOSIS — D485 Neoplasm of uncertain behavior of skin: Secondary | ICD-10-CM | POA: Diagnosis not present

## 2021-01-01 DIAGNOSIS — D225 Melanocytic nevi of trunk: Secondary | ICD-10-CM | POA: Diagnosis not present

## 2021-01-01 DIAGNOSIS — L570 Actinic keratosis: Secondary | ICD-10-CM | POA: Diagnosis not present

## 2021-01-01 DIAGNOSIS — L821 Other seborrheic keratosis: Secondary | ICD-10-CM | POA: Diagnosis not present

## 2021-01-01 DIAGNOSIS — L218 Other seborrheic dermatitis: Secondary | ICD-10-CM | POA: Diagnosis not present

## 2021-01-01 DIAGNOSIS — D2262 Melanocytic nevi of left upper limb, including shoulder: Secondary | ICD-10-CM | POA: Diagnosis not present

## 2021-01-01 DIAGNOSIS — D2272 Melanocytic nevi of left lower limb, including hip: Secondary | ICD-10-CM | POA: Diagnosis not present

## 2021-01-01 DIAGNOSIS — D2271 Melanocytic nevi of right lower limb, including hip: Secondary | ICD-10-CM | POA: Diagnosis not present

## 2021-02-16 DIAGNOSIS — M810 Age-related osteoporosis without current pathological fracture: Secondary | ICD-10-CM | POA: Diagnosis not present

## 2021-02-16 DIAGNOSIS — E7800 Pure hypercholesterolemia, unspecified: Secondary | ICD-10-CM | POA: Diagnosis not present

## 2021-03-19 DIAGNOSIS — E7800 Pure hypercholesterolemia, unspecified: Secondary | ICD-10-CM | POA: Diagnosis not present

## 2021-03-19 DIAGNOSIS — M810 Age-related osteoporosis without current pathological fracture: Secondary | ICD-10-CM | POA: Diagnosis not present

## 2021-06-08 DIAGNOSIS — H40032 Anatomical narrow angle, left eye: Secondary | ICD-10-CM | POA: Diagnosis not present

## 2021-06-08 DIAGNOSIS — Z961 Presence of intraocular lens: Secondary | ICD-10-CM | POA: Diagnosis not present

## 2021-06-08 DIAGNOSIS — H524 Presbyopia: Secondary | ICD-10-CM | POA: Diagnosis not present

## 2021-06-08 DIAGNOSIS — H52203 Unspecified astigmatism, bilateral: Secondary | ICD-10-CM | POA: Diagnosis not present

## 2021-06-28 DIAGNOSIS — R238 Other skin changes: Secondary | ICD-10-CM | POA: Diagnosis not present

## 2021-06-28 DIAGNOSIS — E7800 Pure hypercholesterolemia, unspecified: Secondary | ICD-10-CM | POA: Diagnosis not present

## 2021-07-05 DIAGNOSIS — Z0000 Encounter for general adult medical examination without abnormal findings: Secondary | ICD-10-CM | POA: Diagnosis not present

## 2021-07-05 DIAGNOSIS — E7800 Pure hypercholesterolemia, unspecified: Secondary | ICD-10-CM | POA: Diagnosis not present

## 2021-07-05 DIAGNOSIS — M810 Age-related osteoporosis without current pathological fracture: Secondary | ICD-10-CM | POA: Diagnosis not present

## 2021-07-05 DIAGNOSIS — Z23 Encounter for immunization: Secondary | ICD-10-CM | POA: Diagnosis not present

## 2021-07-06 DIAGNOSIS — D2261 Melanocytic nevi of right upper limb, including shoulder: Secondary | ICD-10-CM | POA: Diagnosis not present

## 2021-07-06 DIAGNOSIS — L821 Other seborrheic keratosis: Secondary | ICD-10-CM | POA: Diagnosis not present

## 2021-07-06 DIAGNOSIS — D225 Melanocytic nevi of trunk: Secondary | ICD-10-CM | POA: Diagnosis not present

## 2021-07-06 DIAGNOSIS — D2262 Melanocytic nevi of left upper limb, including shoulder: Secondary | ICD-10-CM | POA: Diagnosis not present

## 2021-08-17 DIAGNOSIS — M810 Age-related osteoporosis without current pathological fracture: Secondary | ICD-10-CM | POA: Diagnosis not present

## 2021-08-17 DIAGNOSIS — E7800 Pure hypercholesterolemia, unspecified: Secondary | ICD-10-CM | POA: Diagnosis not present

## 2022-01-08 DIAGNOSIS — C44519 Basal cell carcinoma of skin of other part of trunk: Secondary | ICD-10-CM | POA: Diagnosis not present

## 2022-01-08 DIAGNOSIS — L821 Other seborrheic keratosis: Secondary | ICD-10-CM | POA: Diagnosis not present

## 2022-01-08 DIAGNOSIS — X32XXXA Exposure to sunlight, initial encounter: Secondary | ICD-10-CM | POA: Diagnosis not present

## 2022-01-08 DIAGNOSIS — Z85828 Personal history of other malignant neoplasm of skin: Secondary | ICD-10-CM | POA: Diagnosis not present

## 2022-01-08 DIAGNOSIS — D2262 Melanocytic nevi of left upper limb, including shoulder: Secondary | ICD-10-CM | POA: Diagnosis not present

## 2022-01-08 DIAGNOSIS — D485 Neoplasm of uncertain behavior of skin: Secondary | ICD-10-CM | POA: Diagnosis not present

## 2022-01-08 DIAGNOSIS — L570 Actinic keratosis: Secondary | ICD-10-CM | POA: Diagnosis not present

## 2022-01-08 DIAGNOSIS — D2272 Melanocytic nevi of left lower limb, including hip: Secondary | ICD-10-CM | POA: Diagnosis not present

## 2022-01-08 DIAGNOSIS — D2261 Melanocytic nevi of right upper limb, including shoulder: Secondary | ICD-10-CM | POA: Diagnosis not present

## 2022-02-12 DIAGNOSIS — C44519 Basal cell carcinoma of skin of other part of trunk: Secondary | ICD-10-CM | POA: Diagnosis not present

## 2022-02-13 DIAGNOSIS — Z23 Encounter for immunization: Secondary | ICD-10-CM | POA: Diagnosis not present

## 2022-05-27 IMAGING — CT CT CARDIAC CORONARY ARTERY CALCIUM SCORE
3 series · 14 of 20 positions shown, 16 images · non-contrast
Comparison: None.

CLINICAL DATA: Hyperlipidemia

EXAM:
CT CARDIAC CORONARY ARTERY CALCIUM SCORE
TECHNIQUE: Non-contrast imaging through the heart was performed using
prospective ECG gating. Image post processing was performed on an
independent workstation, allowing for quantitative analysis of the
heart and coronary arteries. Note that this exam targets the heart
and the chest was not imaged in its entirety.

[Series 2: calcium scoring 2.00 qr36 bestdiast 69% hrt calciu · axial · 0.44mm/px · z∈[+1764,+1860]mm · 4 of 80 slices shown]
[im 16/80  vessel]
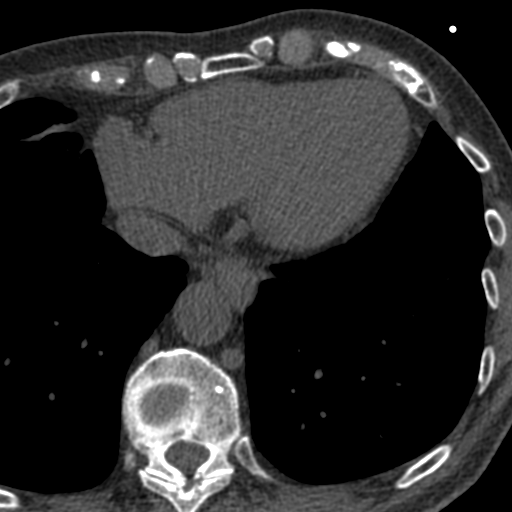
[im 32/80  vessel]
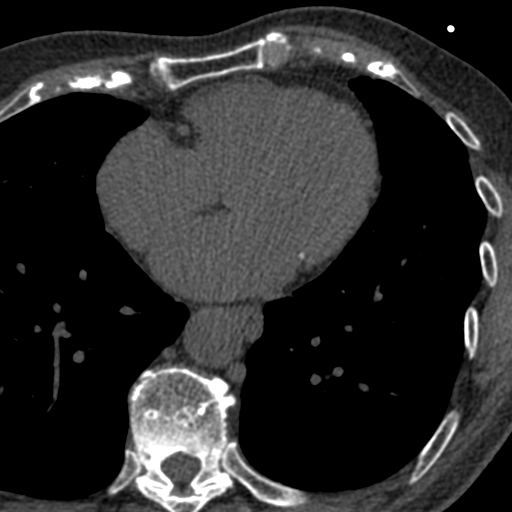
[im 48/80  vessel]
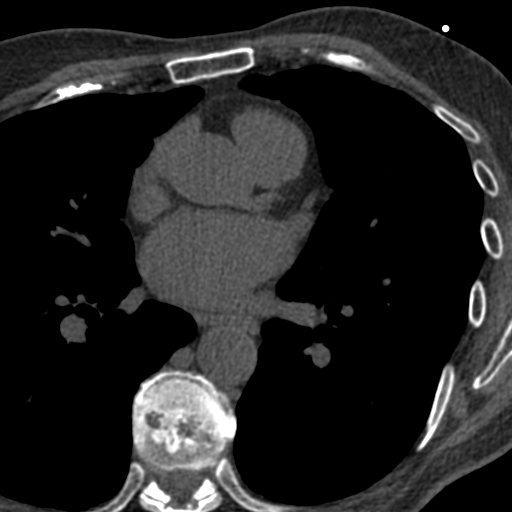
[im 64/80  vessel]
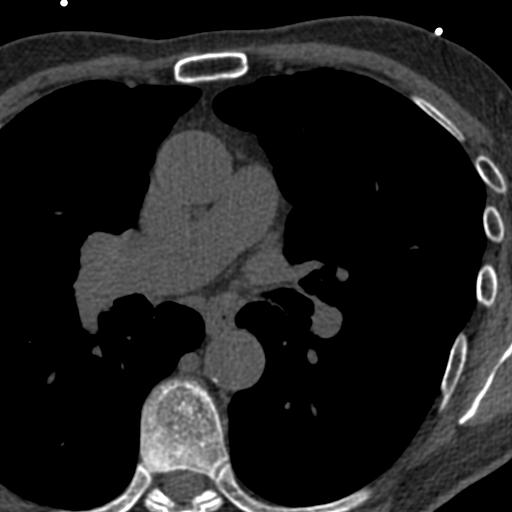

[Series 3: calcium scoring 2.00 br40 bestdiast 69% axial · axial · 0.51mm/px · z∈[+1760,+1864]mm · 5 of 80 slices shown, 7 images]
[im 14/80  vessel]
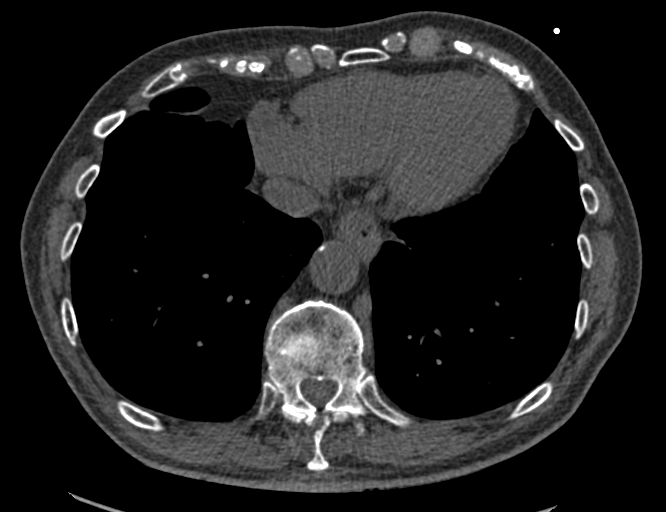
[im 14/80  lung]
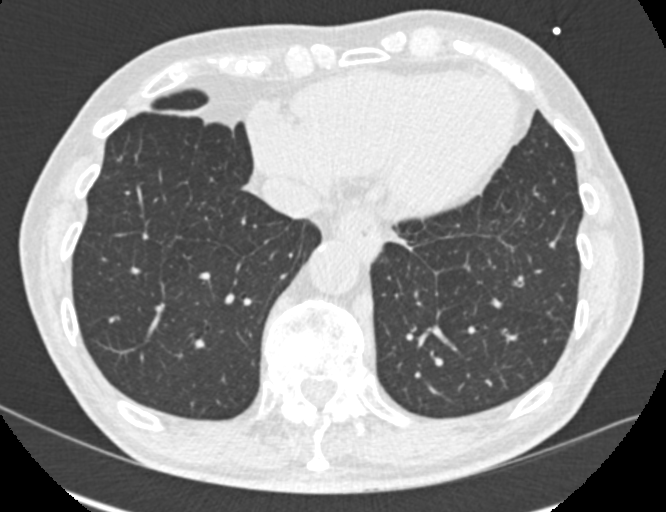
[im 27/80  vessel]
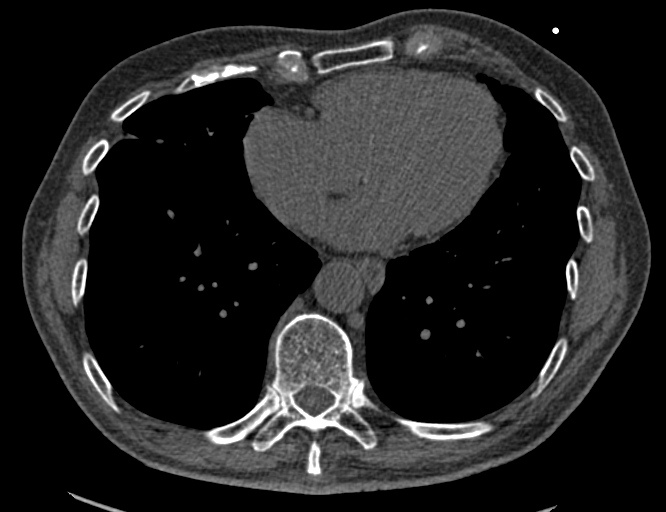
[im 40/80  vessel]
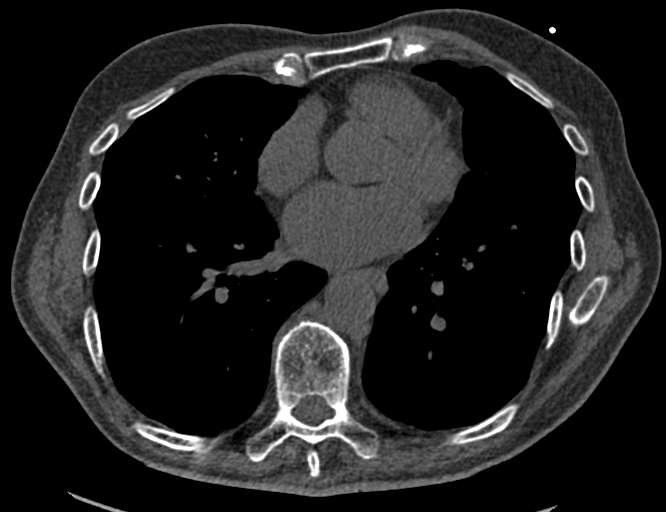
[im 53/80  vessel]
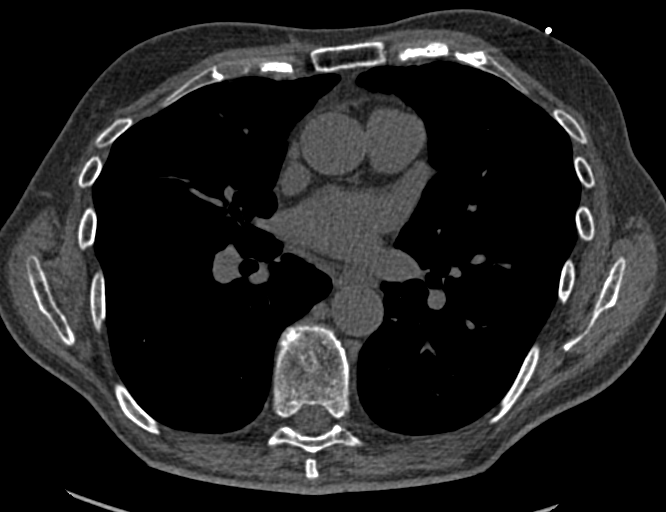
[im 66/80  vessel]
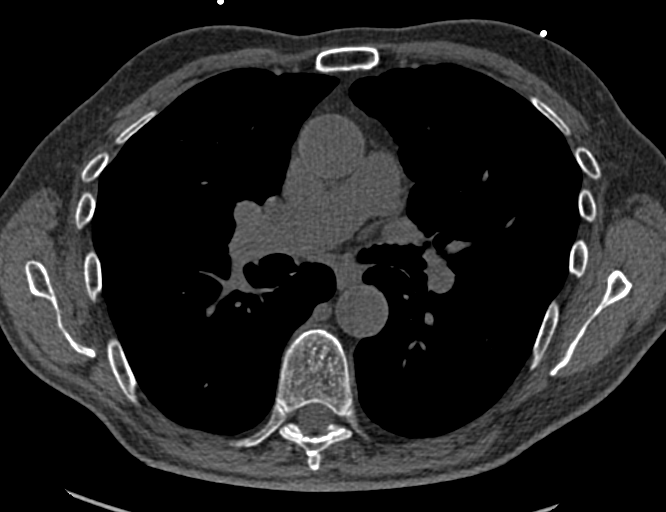
[im 66/80  lung]
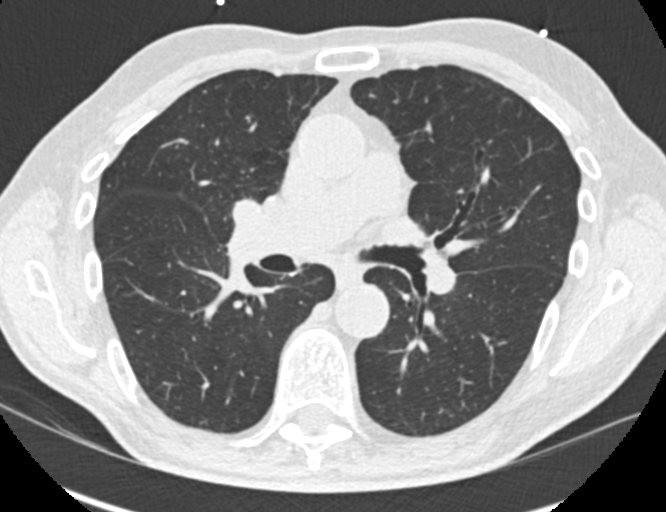

[Series 9: calcium scoring 2.00 br60 bestdiast 69% lungs · axial · 0.52mm/px · z∈[+1760,+1864]mm · 5 of 80 slices shown]
[im 14/80  vessel]
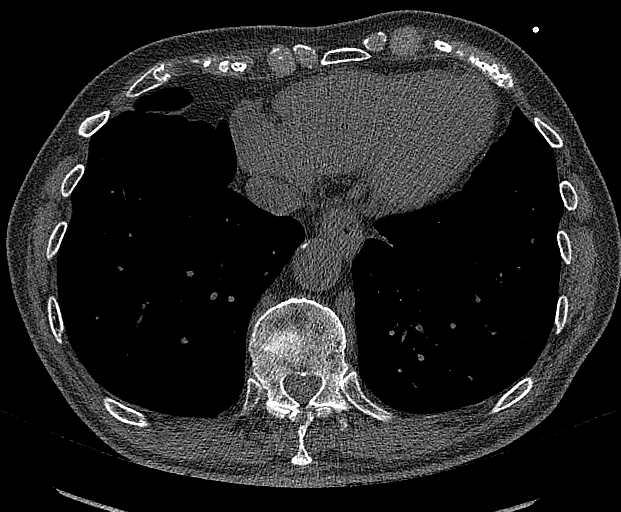
[im 27/80  vessel]
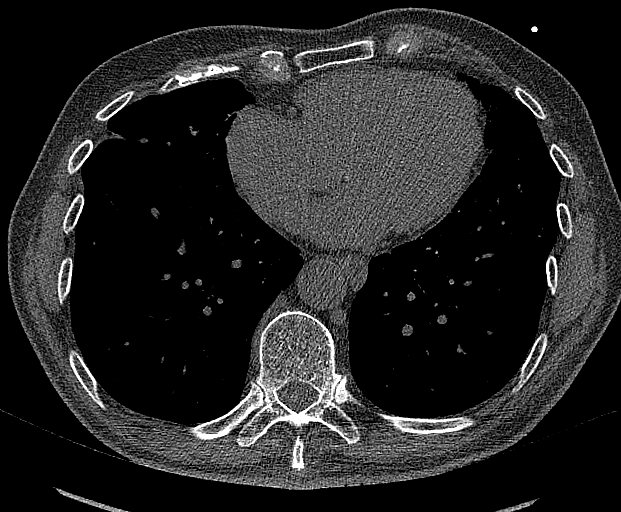
[im 40/80  vessel]
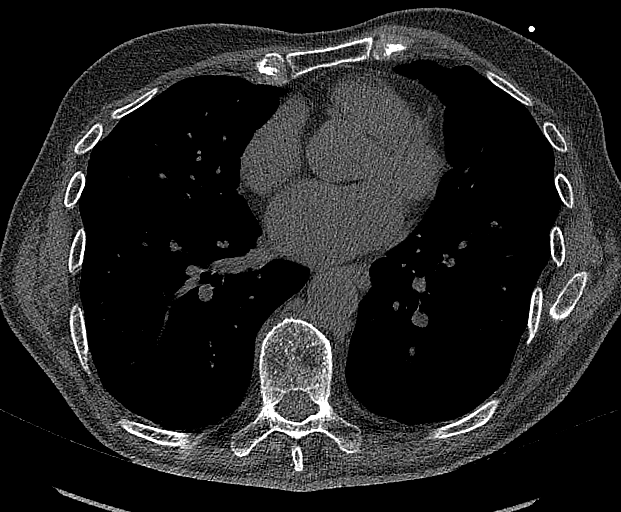
[im 53/80  vessel]
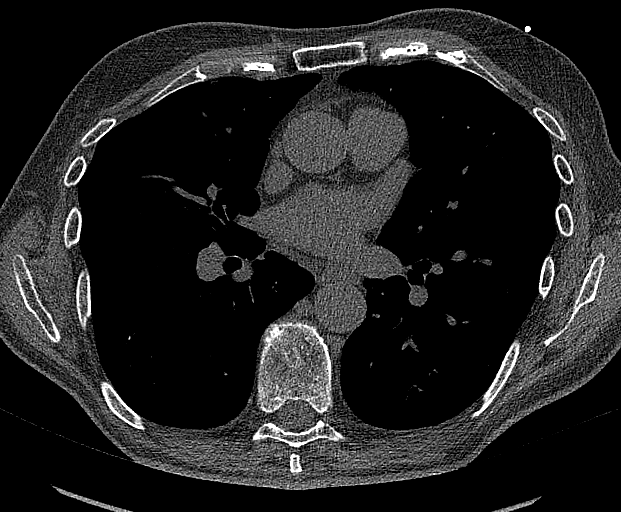
[im 66/80  vessel]
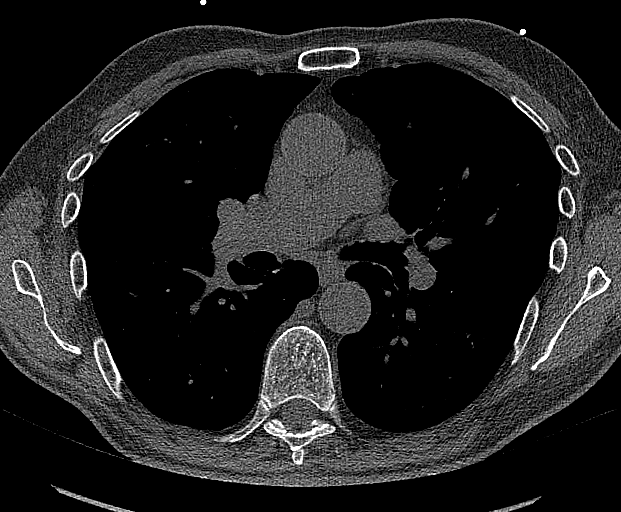

[14 of 20 positions shown; findings below may reference images not displayed]

FINDINGS: CORONARY CALCIUM SCORES:

Left Main: 0

LAD: 0

LCx: 0

RCA: 0

Total Agatston Score: 0

[HOSPITAL] percentile: 0

AORTA MEASUREMENTS:

Ascending Aorta: 31 mm

Descending Aorta: 25 mm

OTHER FINDINGS:

Heart is normal size. Scattered calcifications in the aortic root
and descending thoracic aorta. No aneurysm. No adenopathy. Linear
scarring in the lingula and right middle lobe anteriorly. No
confluent opacities or effusions. Imaging into the upper abdomen
demonstrates no acute findings. Small hiatal hernia. Chest wall soft
tissues are unremarkable. Mild compression deformity noted in a
lower thoracic vertebral body, difficult to determine exact level,
possibly T11 or T12.
IMPRESSION: No visible coronary artery calcifications. Total coronary calcium
score of 0.

Mild compression fracture and in the lower thoracic spine.

Small hiatal hernia.

Scattered aortic atherosclerosis.

## 2022-06-24 DIAGNOSIS — H5203 Hypermetropia, bilateral: Secondary | ICD-10-CM | POA: Diagnosis not present

## 2022-06-24 DIAGNOSIS — Z961 Presence of intraocular lens: Secondary | ICD-10-CM | POA: Diagnosis not present

## 2022-07-16 DIAGNOSIS — D2262 Melanocytic nevi of left upper limb, including shoulder: Secondary | ICD-10-CM | POA: Diagnosis not present

## 2022-07-16 DIAGNOSIS — L570 Actinic keratosis: Secondary | ICD-10-CM | POA: Diagnosis not present

## 2022-07-16 DIAGNOSIS — X32XXXA Exposure to sunlight, initial encounter: Secondary | ICD-10-CM | POA: Diagnosis not present

## 2022-07-16 DIAGNOSIS — D225 Melanocytic nevi of trunk: Secondary | ICD-10-CM | POA: Diagnosis not present

## 2022-07-16 DIAGNOSIS — D2272 Melanocytic nevi of left lower limb, including hip: Secondary | ICD-10-CM | POA: Diagnosis not present

## 2022-07-16 DIAGNOSIS — D2261 Melanocytic nevi of right upper limb, including shoulder: Secondary | ICD-10-CM | POA: Diagnosis not present

## 2022-07-16 DIAGNOSIS — Z85828 Personal history of other malignant neoplasm of skin: Secondary | ICD-10-CM | POA: Diagnosis not present

## 2022-09-25 ENCOUNTER — Encounter: Payer: Self-pay | Admitting: Student

## 2022-09-25 ENCOUNTER — Ambulatory Visit: Payer: Medicare Other | Admitting: Student

## 2022-09-25 VITALS — BP 124/68 | HR 59 | Temp 98.7°F | Ht 69.0 in | Wt 153.0 lb

## 2022-09-25 DIAGNOSIS — Z87442 Personal history of urinary calculi: Secondary | ICD-10-CM

## 2022-09-25 DIAGNOSIS — R7309 Other abnormal glucose: Secondary | ICD-10-CM

## 2022-09-25 DIAGNOSIS — M419 Scoliosis, unspecified: Secondary | ICD-10-CM | POA: Diagnosis not present

## 2022-09-25 DIAGNOSIS — E7800 Pure hypercholesterolemia, unspecified: Secondary | ICD-10-CM | POA: Diagnosis not present

## 2022-09-25 DIAGNOSIS — E782 Mixed hyperlipidemia: Secondary | ICD-10-CM | POA: Insufficient documentation

## 2022-09-25 DIAGNOSIS — N400 Enlarged prostate without lower urinary tract symptoms: Secondary | ICD-10-CM | POA: Diagnosis not present

## 2022-09-25 DIAGNOSIS — M810 Age-related osteoporosis without current pathological fracture: Secondary | ICD-10-CM

## 2022-09-25 NOTE — Progress Notes (Signed)
Location:  [facility] clinic [facility].   Provider: Dr. Earnestine Mealing  Code Status: Full Code Goals of Care:     09/25/2022    1:50 PM  Advanced Directives  Does Patient Have a Medical Advance Directive? Yes  Type of Estate agent of [place];Living will  Does patient want to make changes to medical advance directive? No - Patient declined  Copy of Healthcare Power of Attorney in Chart? No - copy requested     Chief Complaint  Patient presents with   Establish Care    NP to Establish Care.    Quality Metric Gaps    To discuss need for Pneumonia, Zoster and AWV or postpone is patient refuses.     HPI: Patient is a 89 y.o. male seen today to Establish Care.   Matters Most; Stay healthy so he can continue to take care of Jason Mendez (wife). Play 9 holes of Golf on his 89th Birthday.    Mobility: He has not had any falls in the last year. Walks independently. He drives.    Multimorbidity:  Osteoporosis -- has been on alendronate without issue for years.  BPH- no symptoms. Doesn't get up at night.    Medications Famotidine was when his voice was getting raspy; ENT said it was because his vocal cords were kinked (probably from reflux).   Mentation: No concerns   [place] originally from [place]. Met faye in [place] at a parks and rec conference.  [place] [employer]. Masters at [employer] for parks and recreation. 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 He did some physical education and then he went to [employer] in the service but then he didn't have to get drafted. He was going to [employer] and he never heard back from them.  45 Years married in July 29. She has a son and his family in [place] and Jason Mendez has a son in [place].    Past Medical History:  Diagnosis Date   High cholesterol     Past Surgical History:  Procedure Laterality Date   BUNIONECTOMY      No Known Allergies  Outpatient  Encounter Medications as of 09/25/2022  Medication Sig   alendronate (FOSAMAX) 70 MG tablet Take 70 mg by mouth once a week.   atorvastatin (LIPITOR) 20 MG tablet Take 20 mg by mouth daily.   calcium carbonate (OSCAL) 1500 (600 Ca) MG TABS tablet Take by mouth daily with breakfast.   cholecalciferol (VITAMIN D3) 25 MCG (1000 UNIT) tablet Take 1,000 Units by mouth 2 (two) times daily.   magnesium 30 MG tablet Take 30 mg by mouth at bedtime.   Multiple Vitamin (MULTIVITAMIN) tablet Take 1 tablet by mouth daily.   famotidine (PEPCID) 20 MG tablet 1 tablet Orally daily   [DISCONTINUED] famotidine (PEPCID) 20 MG tablet Take 20 mg by mouth daily.   No facility-administered encounter medications on file as of 09/25/2022.    Review of Systems:  Review of Systems  Health Maintenance  Topic Date Due   Pneumonia Vaccine 7+ Years old (1 of 1 - PCV) Never done   Medicare Annual Wellness (AWV)  07/05/2022   INFLUENZA VACCINE  12/19/2022   DTaP/Tdap/Td (2 - Td or Tdap) 05/30/2029   COVID-19 Vaccine  Completed   Zoster Vaccines- Shingrix  Completed   HPV VACCINES  Aged Out    Physical Exam: Vitals:   09/25/22 1346  BP:  124/68  Pulse: (!) 59  Temp: 98.7 F (37.1 C)  SpO2: 96%  Weight: 153 lb (69.4 kg)  Height: 5\' 9"  (1.753 m)   Body mass index is 22.59 kg/m. Physical Exam Constitutional:      Appearance: Normal appearance. He is normal weight.  HENT:     Right Ear: Tympanic membrane and ear canal normal.     Left Ear: Tympanic membrane and ear canal normal.  Cardiovascular:     Rate and Rhythm: Normal rate and regular rhythm.     Pulses: Normal pulses.     Heart sounds: Normal heart sounds.  Pulmonary:     Effort: Pulmonary effort is normal.     Breath sounds: Normal breath sounds.  Abdominal:     General: Abdomen is flat. Bowel sounds are normal.     Palpations: Abdomen is soft.  Musculoskeletal:     Right lower leg: No edema.     Left lower leg: No edema.  Skin:    General:  Skin is warm and dry.     Capillary Refill: Capillary refill takes less than 2 seconds.  Neurological:     General: No focal deficit present.     Mental Status: He is alert and oriented to person, place, and time. Mental status is at baseline.     Comments: SLUMS 28/30      Labs reviewed: Basic Metabolic Panel: No results for input(s): "NA", "K", "CL", "CO2", "GLUCOSE", "BUN", "CREATININE", "CALCIUM", "MG", "PHOS", "TSH" in the last 8760 hours. Liver Function Tests: No results for input(s): "AST", "ALT", "ALKPHOS", "BILITOT", "PROT", "ALBUMIN" in the last 8760 hours. No results for input(s): "LIPASE", "AMYLASE" in the last 8760 hours. No results for input(s): "AMMONIA" in the last 8760 hours. CBC: No results for input(s): "WBC", "NEUTROABS", "HGB", "HCT", "MCV", "PLT" in the last 8760 hours. Lipid Panel: No results for input(s): "CHOL", "HDL", "LDLCALC", "TRIG", "CHOLHDL", "LDLDIRECT" in the last 8760 hours. No results found for: "HGBA1C"  Procedures since last visit: No results found.  Assessment/Plan 1. Age-related osteoporosis without current pathological fracture No fractures. Continue Fosamax, Vitamin D and calcium.   2. Benign prostatic hyperplasia without lower urinary tract symptoms No symptoms, CTM.   3. Pure hypercholesterolemia Elevated cholesterol Lipids ordered. 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 CTM.   5. History of urinary calculi In his 57s, none since.   6. Elevated glucose Check hgb A1c    Labs/tests ordered:  * No order type specified * Next appt:  Visit date not found    I spent greater than 40 minutes for the care of this patient in face to face time, chart review, clinical documentation, patient education.

## 2022-09-26 DIAGNOSIS — R7309 Other abnormal glucose: Secondary | ICD-10-CM | POA: Diagnosis not present

## 2022-09-26 DIAGNOSIS — M810 Age-related osteoporosis without current pathological fracture: Secondary | ICD-10-CM | POA: Diagnosis not present

## 2022-09-26 DIAGNOSIS — E7800 Pure hypercholesterolemia, unspecified: Secondary | ICD-10-CM | POA: Diagnosis not present

## 2022-09-27 LAB — CBC WITH DIFFERENTIAL/PLATELET
Absolute Monocytes: 490 cells/uL (ref 200–950)
Basophils Absolute: 47 cells/uL (ref 0–200)
Basophils Relative: 0.8 %
Eosinophils Absolute: 100 cells/uL (ref 15–500)
Eosinophils Relative: 1.7 %
HCT: 47.8 % (ref 38.5–50.0)
Hemoglobin: 14.8 g/dL (ref 13.2–17.1)
Lymphs Abs: 2331 cells/uL (ref 850–3900)
MCH: 29.8 pg (ref 27.0–33.0)
MCHC: 31 g/dL — ABNORMAL LOW (ref 32.0–36.0)
MCV: 96.4 fL (ref 80.0–100.0)
MPV: 10.6 fL (ref 7.5–12.5)
Monocytes Relative: 8.3 %
Neutro Abs: 2932 cells/uL (ref 1500–7800)
Neutrophils Relative %: 49.7 %
Platelets: 364 10*3/uL (ref 140–400)
RBC: 4.96 10*6/uL (ref 4.20–5.80)
RDW: 12.5 % (ref 11.0–15.0)
Total Lymphocyte: 39.5 %
WBC: 5.9 10*3/uL (ref 3.8–10.8)

## 2022-09-27 LAB — LIPID PANEL
Cholesterol: 185 mg/dL (ref <200)
HDL: 79 mg/dL (ref >=40)
LDL Cholesterol (Calc): 90 mg/dL (calc)
Non-HDL Cholesterol (Calc): 106 mg/dL (calc) (ref <130)
Total CHOL/HDL Ratio: 2.3 (calc) (ref <5.0)
Triglycerides: 73 mg/dL (ref <150)

## 2022-09-27 LAB — COMPLETE METABOLIC PANEL WITH GFR
AG Ratio: 1.8 (calc) (ref 1.0–2.5)
ALT: 31 U/L (ref 9–46)
AST: 38 U/L — ABNORMAL HIGH (ref 10–35)
Albumin: 4.5 g/dL (ref 3.6–5.1)
Alkaline phosphatase (APISO): 72 U/L (ref 35–144)
BUN: 21 mg/dL (ref 7–25)
CO2: 31 mmol/L (ref 20–32)
Calcium: 9.7 mg/dL (ref 8.6–10.3)
Chloride: 101 mmol/L (ref 98–110)
Creat: 0.99 mg/dL (ref 0.70–1.22)
Globulin: 2.5 g/dL (calc) (ref 1.9–3.7)
Glucose, Bld: 85 mg/dL (ref 65–99)
Potassium: 4.7 mmol/L (ref 3.5–5.3)
Sodium: 139 mmol/L (ref 135–146)
Total Bilirubin: 0.8 mg/dL (ref 0.2–1.2)
Total Protein: 7 g/dL (ref 6.1–8.1)
eGFR: 73 mL/min/{1.73_m2} (ref >=60)

## 2022-09-27 LAB — HEMOGLOBIN A1C
Hgb A1c MFr Bld: 5.4 % of total Hgb (ref <5.7)
Mean Plasma Glucose: 108 mg/dL
eAG (mmol/L): 6 mmol/L

## 2022-09-27 LAB — TIQ-MISC

## 2022-09-27 LAB — VITAMIN D 25 HYDROXY (VIT D DEFICIENCY, FRACTURES): Vit D, 25-Hydroxy: 66 ng/mL (ref 30–100)

## 2022-11-12 ENCOUNTER — Encounter: Payer: Medicare Other | Admitting: Nurse Practitioner

## 2022-11-26 ENCOUNTER — Encounter: Payer: Self-pay | Admitting: Nurse Practitioner

## 2022-11-26 ENCOUNTER — Ambulatory Visit (INDEPENDENT_AMBULATORY_CARE_PROVIDER_SITE_OTHER): Payer: Medicare Other | Admitting: Nurse Practitioner

## 2022-11-26 ENCOUNTER — Encounter: Payer: Medicare Other | Admitting: Nurse Practitioner

## 2022-11-26 VITALS — BP 124/80 | HR 55 | Temp 98.4°F | Ht 69.0 in | Wt 153.5 lb

## 2022-11-26 DIAGNOSIS — Z0000 Encounter for general adult medical examination without abnormal findings: Secondary | ICD-10-CM | POA: Diagnosis not present

## 2022-11-26 MED ORDER — FAMOTIDINE 20 MG PO TABS: ORAL_TABLET | ORAL | 3 refills | Status: DC

## 2022-11-26 NOTE — Progress Notes (Signed)
Subjective:   Jason Mendez is a 89 y.o. male who presents for Medicare Annual/Subsequent preventive examination.  Visit Complete: [facility] at [facility]   Patient Medicare AWV questionnaire was completed by the patient on 11/26/22; I have confirmed that all information answered by patient is correct and no changes since this date.  Review of Systems     Cardiac Risk Factors include: advanced age (>58men, >65 women);male gender     Objective:    Today's Vitals   11/26/22 0949  BP: 124/80  Pulse: (!) 55  Temp: 98.4 F (36.9 C)  TempSrc: Temporal  SpO2: 98%  Weight: 153 lb 8 oz (69.6 kg)  Height: 5\' 9"  (1.753 m)   Body mass index is 22.67 kg/m.     11/26/2022    9:48 AM 09/25/2022    1:50 PM  Advanced Directives  Does Patient Have a Medical Advance Directive? Yes Yes  Type of Estate agent of [place];Living will Healthcare Power of [place];Living will  Does patient want to make changes to medical advance directive? No - Patient declined No - Patient declined  Copy of Healthcare Power of Attorney in Chart? No - copy requested No - copy requested    Current Medications (verified) Outpatient Encounter Medications as of 11/26/2022  Medication Sig   alendronate (FOSAMAX) 70 MG tablet Take 70 mg by mouth once a week.   atorvastatin (LIPITOR) 20 MG tablet Take 20 mg by mouth daily.   calcium carbonate (OSCAL) 1500 (600 Ca) MG TABS tablet Take by mouth daily with breakfast.   cholecalciferol (VITAMIN D3) 25 MCG (1000 UNIT) tablet Take 1,000 Units by mouth 2 (two) times daily.   magnesium 30 MG tablet Take 30 mg by mouth at bedtime.   Multiple Vitamin (MULTIVITAMIN) tablet Take 1 tablet by mouth daily.   [DISCONTINUED] famotidine (PEPCID) 20 MG tablet 1 tablet Orally daily   famotidine (PEPCID) 20 MG tablet 1 tablet Orally daily   No facility-administered encounter medications on file as of 11/26/2022.    Allergies (verified) Patient has no known  allergies.   History: Past Medical History:  Diagnosis Date   High cholesterol    Past Surgical History:  Procedure Laterality Date   BUNIONECTOMY     Family History  Problem Relation Age of Onset   Rheum arthritis Mother    Osteoarthritis Mother    Stroke Father    Diabetes Father    Hypertension Father    Social History   Socioeconomic History   Marital status: Married    Spouse name: Not on file   Number of children: Not on file   Years of education: Not on file   Highest education level: Not on file  Occupational History   Not on file  Tobacco Use   Smoking status: Former    Types: Cigarettes    Quit date: 05/30/1974    Years since quitting: 48.5   Smokeless tobacco: Never  Vaping Use   Vaping Use: Never used  Substance and Sexual Activity   Alcohol use: Yes    Alcohol/week: 14.0 standard drinks of alcohol    Types: 14 Glasses of wine per week    Comment: 2 drinks daiy wine or scotch   Drug use: Never   Sexual activity: Not on file  Other Topics Concern   Not on file  Social History Narrative   Not on file   Social Determinants of Health   Financial Resource Strain: Not on file  Food Insecurity: Not on file  Transportation Needs: Not on file  Physical Activity: Not on file  Stress: Not on file  Social Connections: Not on file    Tobacco Counseling Counseling given: Not Answered   Clinical Intake:  Pre-visit preparation completed: Yes  Pain : No/denies pain     BMI - recorded: 22.67 Nutritional Status: BMI of 19-24  Normal Nutritional Risks: None Diabetes: No  How often do you need to have someone help you when you read instructions, pamphlets, or other written materials from your doctor or pharmacy?: 1 - Never         Activities of Daily Living    11/26/2022    9:59 AM  In your present state of health, do you have any difficulty performing the following activities:  Hearing? 0  Vision? 0  Difficulty concentrating or making  decisions? 0  Walking or climbing stairs? 0  Dressing or bathing? 0  Doing errands, shopping? 0  Preparing Food and eating ? N  Using the Toilet? N  In the past six months, have you accidently leaked urine? N  Do you have problems with loss of bowel control? N  Managing your Medications? N  Managing your Finances? N  Housekeeping or managing your Housekeeping? N    Patient Care Team: Earnestine Mealing, MD as PCP - General (Family Medicine)  Indicate any recent Medical Services you may have received from other than [facility] providers in the past year (date may be approximate).     Assessment:   This is a routine wellness examination for Jason Mendez.  Hearing/Vision screen Hearing Screening - Comments:: No hearing issues  Vision Screening - Comments:: Last eye exam less than 12 months ago with Dr. Randon Goldsmith  Dietary issues and exercise activities discussed:     Goals Addressed             This Visit's Progress    Patient Stated       To play 9 holes of golf on his 89th birthday        Depression Screen    11/26/2022    9:47 AM 09/25/2022    1:50 PM  PHQ 2/9 Scores  PHQ - 2 Score 0 0    Fall Risk    11/26/2022    9:47 AM 09/25/2022    1:50 PM  Fall Risk   Falls in the past year? 0 0  Number falls in past yr: 0 0  Injury with Fall? 0 0  Risk for fall due to : No Fall Risks   Follow up Falls evaluation completed     MEDICARE RISK AT HOME:   TIMED UP AND GO:  Was the test performed?  No    Cognitive Function:        Immunizations Immunization History  Administered Date(s) Administered   Covid-19, Mrna,Vaccine(Spikevax)20yrs and older 08/27/2022   Moderna Covid-19 Vaccine Bivalent Booster 60yrs & up 02/07/2021, 10/16/2021   Moderna Sars-Covid-2 Vaccination 06/01/2019, 06/29/2019, 09/29/2020, 02/07/2022   RSV,unspecified 03/30/2022   Tdap 05/31/2019   Zoster Recombinant(Shingrix) 02/05/2017    TDAP status: Up to date  Flu Vaccine status: Up to  date  Pneumococcal vaccine status: Up to date  Covid-19 vaccine status: Information provided on how to obtain vaccines.   Qualifies for Shingles Vaccine? Yes   Zostavax completed No   Shingrix Completed?: Yes  Screening Tests Health Maintenance  Topic Date Due   Pneumonia Vaccine 58+ Years old (1 of 1 - PCV) Never done  INFLUENZA VACCINE  12/19/2022   Medicare Annual Wellness (AWV)  11/26/2023   DTaP/Tdap/Td (2 - Td or Tdap) 05/30/2029   COVID-19 Vaccine  Completed   Zoster Vaccines- Shingrix  Completed   HPV VACCINES  Aged Out    Health Maintenance  Health Maintenance Due  Topic Date Due   Pneumonia Vaccine 62+ Years old (1 of 1 - PCV) Never done    Colorectal cancer screening: No longer required.   Lung Cancer Screening: (Low Dose CT Chest recommended if Age 72-80 years, 20 pack-year currently smoking OR have quit w/in 15years.) does not qualify.   Lung Cancer Screening Referral: na  Additional Screening:  Hepatitis C Screening: does not qualify; Completed   Vision Screening: Recommended annual ophthalmology exams for early detection of glaucoma and other disorders of the eye. Is the patient up to date with their annual eye exam?  Yes  Who is the provider or what is the name of the office in which the patient attends annual eye exams? [facility]  If pt is not established with a provider, would they like to be referred to a provider to establish care? No .   Dental Screening: Recommended annual dental exams for proper oral hygiene   Community Resource Referral / Chronic Care Management: CRR required this visit?  No   CCM required this visit?  No     Plan:     I have personally reviewed and noted the following in the patient's chart:   Medical and social history Use of alcohol, tobacco or illicit drugs  Current medications and supplements including opioid prescriptions. Patient is not currently taking opioid prescriptions. Functional ability and  status Nutritional status Physical activity Advanced directives List of other physicians Hospitalizations, surgeries, and ER visits in previous 12 months Vitals Screenings to include cognitive, depression, and falls Referrals and appointments  In addition, I have reviewed and discussed with patient certain preventive protocols, quality metrics, and best practice recommendations. A written personalized care plan for preventive services as well as general preventive health recommendations were provided to patient.     Sharon Seller, NP   11/26/2022

## 2022-11-26 NOTE — Patient Instructions (Addendum)
  Jason Mendez , Thank you for taking time to come for your Medicare Wellness Visit. I appreciate your ongoing commitment to your health goals. Please review the following plan we discussed and let me know if I can assist you in the future.   Please bring advanced directives so we can place on file  Please look up and let us know what pneumonia vaccines you have received and the date you received them.   These are the goals we discussed:  Goals      Patient Stated     To play 9 holes of golf on his 89th birthday         This is a list of the screening recommended for you and due dates:  Health Maintenance  Topic Date Due   Pneumonia Vaccine (1 of 1 - PCV) Never done   Flu Shot  12/19/2022   Medicare Annual Wellness Visit  11/26/2023   DTaP/Tdap/Td vaccine (2 - Td or Tdap) 05/30/2029   COVID-19 Vaccine  Completed   Zoster (Shingles) Vaccine  Completed   HPV Vaccine  Aged Out

## 2023-01-01 ENCOUNTER — Encounter: Payer: Self-pay | Admitting: Student

## 2023-01-01 ENCOUNTER — Ambulatory Visit: Payer: Medicare Other | Admitting: Student

## 2023-01-01 VITALS — BP 128/76 | HR 58 | Temp 97.6°F | Ht 69.0 in | Wt 158.0 lb

## 2023-01-01 DIAGNOSIS — G4700 Insomnia, unspecified: Secondary | ICD-10-CM

## 2023-01-01 DIAGNOSIS — N400 Enlarged prostate without lower urinary tract symptoms: Secondary | ICD-10-CM | POA: Diagnosis not present

## 2023-01-01 DIAGNOSIS — R6882 Decreased libido: Secondary | ICD-10-CM

## 2023-01-01 DIAGNOSIS — N529 Male erectile dysfunction, unspecified: Secondary | ICD-10-CM | POA: Insufficient documentation

## 2023-01-01 DIAGNOSIS — R238 Other skin changes: Secondary | ICD-10-CM | POA: Insufficient documentation

## 2023-01-01 DIAGNOSIS — M810 Age-related osteoporosis without current pathological fracture: Secondary | ICD-10-CM

## 2023-01-01 DIAGNOSIS — Z86010 Personal history of colonic polyps: Secondary | ICD-10-CM | POA: Insufficient documentation

## 2023-01-01 DIAGNOSIS — Z860101 Personal history of adenomatous and serrated colon polyps: Secondary | ICD-10-CM

## 2023-01-01 DIAGNOSIS — Z87442 Personal history of urinary calculi: Secondary | ICD-10-CM | POA: Insufficient documentation

## 2023-01-01 DIAGNOSIS — H5702 Anisocoria: Secondary | ICD-10-CM | POA: Insufficient documentation

## 2023-01-01 DIAGNOSIS — N4342 Spermatocele of epididymis, multiple: Secondary | ICD-10-CM | POA: Insufficient documentation

## 2023-01-01 DIAGNOSIS — M419 Scoliosis, unspecified: Secondary | ICD-10-CM

## 2023-01-01 DIAGNOSIS — Z0001 Encounter for general adult medical examination with abnormal findings: Secondary | ICD-10-CM | POA: Insufficient documentation

## 2023-01-01 DIAGNOSIS — M7071 Other bursitis of hip, right hip: Secondary | ICD-10-CM | POA: Insufficient documentation

## 2023-01-01 DIAGNOSIS — L719 Rosacea, unspecified: Secondary | ICD-10-CM | POA: Insufficient documentation

## 2023-01-01 DIAGNOSIS — E7800 Pure hypercholesterolemia, unspecified: Secondary | ICD-10-CM

## 2023-01-01 DIAGNOSIS — R0683 Snoring: Secondary | ICD-10-CM | POA: Insufficient documentation

## 2023-01-01 HISTORY — DX: Personal history of adenomatous and serrated colon polyps: Z86.0101

## 2023-01-01 HISTORY — DX: Decreased libido: R68.82

## 2023-01-01 HISTORY — DX: Spermatocele of epididymis, multiple: N43.42

## 2023-01-01 HISTORY — DX: Male erectile dysfunction, unspecified: N52.9

## 2023-01-01 HISTORY — DX: Personal history of urinary calculi: Z87.442

## 2023-01-01 MED ORDER — TRAZODONE HCL 50 MG PO TABS
50.0000 mg | ORAL_TABLET | Freq: Every day | ORAL | 2 refills | Status: DC
Start: 2023-01-01 — End: 2023-08-07

## 2023-01-01 NOTE — Patient Instructions (Signed)
Please take Trazodone 1 hour before you are ready to go to bed.

## 2023-01-01 NOTE — Progress Notes (Signed)
[facility] clinic [facility].   Provider: Dr. Earnestine Mealing  Code Status: Full Code Goals of Care:     01/01/2023    2:43 PM  Advanced Directives  Does Patient Have a Medical Advance Directive? Yes  Type of Estate agent of [place];Living will  Does patient want to make changes to medical advance directive? No - Patient declined  Copy of Healthcare Power of Attorney in Chart? Yes - validated most recent copy scanned in chart (See row information)     Chief Complaint  Patient presents with   Acute Visit    Complains of Sleep issues. Sometimes it is hard to go to sleep     HPI: Patient is a 89 y.o. male seen today for an acute visit for issues with sleeping. Hard to go to sleep.   He has a lot of thoughts at night. He counts and says his ABCs and then he would wake up 1 hour later. His mind would start wandering again.   He took some trazodone and he has for the last 2 weeks and it's been great.  He has numerous concerns about his wife. Wonders about progression of her dementia.   Past Medical History:  Diagnosis Date   High cholesterol     Past Surgical History:  Procedure Laterality Date   BUNIONECTOMY      No Known Allergies  Outpatient Encounter Medications as of 01/01/2023  Medication Sig   alendronate (FOSAMAX) 70 MG tablet Take 70 mg by mouth once a week.   atorvastatin (LIPITOR) 20 MG tablet Take 20 mg by mouth daily.   calcium carbonate (OSCAL) 1500 (600 Ca) MG TABS tablet Take by mouth daily with breakfast.   cholecalciferol (VITAMIN D3) 25 MCG (1000 UNIT) tablet Take 1,000 Units by mouth 2 (two) times daily.   famotidine (PEPCID) 20 MG tablet 1 tablet Orally daily   magnesium 30 MG tablet Take 30 mg by mouth at bedtime.   Multiple Vitamin (MULTIVITAMIN) tablet Take 1 tablet by mouth daily.   traZODone (DESYREL) 50 MG tablet Take 1 tablet (50 mg total) by mouth at bedtime.   No facility-administered encounter medications on file as of  01/01/2023.    Review of Systems:  Review of Systems  Health Maintenance  Topic Date Due   COVID-19 Vaccine (8 - 2023-24 season) 12/27/2022   INFLUENZA VACCINE  12/19/2022   Medicare Annual Wellness (AWV)  11/26/2023   DTaP/Tdap/Td (2 - Td or Tdap) 05/30/2029   Pneumonia Vaccine 69+ Years old  Completed   Zoster Vaccines- Shingrix  Completed   HPV VACCINES  Aged Out    Physical Exam: Vitals:   01/01/23 1441  BP: 128/76  Pulse: (!) 58  Temp: 97.6 F (36.4 C)  SpO2: 99%  Weight: 158 lb (71.7 kg)  Height: 5\' 9"  (1.753 m)   Body mass index is 23.33 kg/m. Physical Exam Vitals reviewed.  Constitutional:      Appearance: Normal appearance.  Cardiovascular:     Rate and Rhythm: Normal rate and regular rhythm.  Pulmonary:     Effort: Pulmonary effort is normal.     Breath sounds: Normal breath sounds.  Neurological:     Mental Status: He is alert and oriented to person, place, and time.  Psychiatric:        Mood and Affect: Mood normal.        Behavior: Behavior normal.     Labs reviewed: Basic Metabolic Panel: Recent Labs  09/26/22 0727  NA 139  K 4.7  CL 101  CO2 31  GLUCOSE 85  BUN 21  CREATININE 0.99  CALCIUM 9.7   Liver Function Tests: Recent Labs    09/26/22 0727  AST 38*  ALT 31  BILITOT 0.8  PROT 7.0   No results for input(s): "LIPASE", "AMYLASE" in the last 8760 hours. No results for input(s): "AMMONIA" in the last 8760 hours. CBC: Recent Labs    09/26/22 0727  WBC 5.9  NEUTROABS 2,932  HGB 14.8  HCT 47.8  MCV 96.4  PLT 364   Lipid Panel: Recent Labs    09/26/22 0727  CHOL 185  HDL 79  LDLCALC 90  TRIG 73  CHOLHDL 2.3   Lab Results  Component Value Date   HGBA1C 5.4 09/26/2022    Procedures since last visit: No results found.  Assessment/Plan Insomnia, unspecified type - Plan: traZODone (DESYREL) 50 MG tablet  Age-related osteoporosis without current pathological fracture  Benign prostatic hyperplasia without  lower urinary tract symptoms  Kyphoscoliosis  Pure hypercholesterolemia Poor sleep recently with improvement after initiating trazodone. F/u PRN. Continue fosamax. No BPH Symptoms at this time. No back pain at this time. Continue to monitor cholesterol annually.  Labs/tests ordered:  * No order type specified * Next appt:  Visit date not found   I spent greater than 50 minutes for the care of this patient in face to face time, chart review, clinical documentation, patient education.

## 2023-02-07 ENCOUNTER — Other Ambulatory Visit: Payer: Self-pay | Admitting: *Deleted

## 2023-02-07 MED ORDER — ATORVASTATIN CALCIUM 20 MG PO TABS: 20.0000 mg | ORAL_TABLET | Freq: Every day | ORAL | 1 refills | Status: DC

## 2023-02-07 NOTE — Telephone Encounter (Signed)
Patient requested refill to be sent to [facility].

## 2023-02-17 DIAGNOSIS — D2261 Melanocytic nevi of right upper limb, including shoulder: Secondary | ICD-10-CM | POA: Diagnosis not present

## 2023-02-17 DIAGNOSIS — L538 Other specified erythematous conditions: Secondary | ICD-10-CM | POA: Diagnosis not present

## 2023-02-17 DIAGNOSIS — D225 Melanocytic nevi of trunk: Secondary | ICD-10-CM | POA: Diagnosis not present

## 2023-02-17 DIAGNOSIS — D485 Neoplasm of uncertain behavior of skin: Secondary | ICD-10-CM | POA: Diagnosis not present

## 2023-02-17 DIAGNOSIS — L820 Inflamed seborrheic keratosis: Secondary | ICD-10-CM | POA: Diagnosis not present

## 2023-02-17 DIAGNOSIS — D2271 Melanocytic nevi of right lower limb, including hip: Secondary | ICD-10-CM | POA: Diagnosis not present

## 2023-02-17 DIAGNOSIS — D2262 Melanocytic nevi of left upper limb, including shoulder: Secondary | ICD-10-CM | POA: Diagnosis not present

## 2023-02-17 DIAGNOSIS — D0461 Carcinoma in situ of skin of right upper limb, including shoulder: Secondary | ICD-10-CM | POA: Diagnosis not present

## 2023-02-17 DIAGNOSIS — D2272 Melanocytic nevi of left lower limb, including hip: Secondary | ICD-10-CM | POA: Diagnosis not present

## 2023-02-17 DIAGNOSIS — Z85828 Personal history of other malignant neoplasm of skin: Secondary | ICD-10-CM | POA: Diagnosis not present

## 2023-02-26 ENCOUNTER — Other Ambulatory Visit: Payer: Self-pay

## 2023-02-26 MED ORDER — ALENDRONATE SODIUM 70 MG PO TABS: 70.0000 mg | ORAL_TABLET | ORAL | 3 refills | Status: DC

## 2023-04-07 DIAGNOSIS — D0461 Carcinoma in situ of skin of right upper limb, including shoulder: Secondary | ICD-10-CM | POA: Diagnosis not present

## 2023-04-23 ENCOUNTER — Other Ambulatory Visit: Payer: Self-pay | Admitting: Student

## 2023-04-24 NOTE — Telephone Encounter (Signed)
Patient has request early refill on medication Atorvastatin. Medication pend and sent to PCP Earnestine Mealing, MD .

## 2023-07-21 DIAGNOSIS — Z961 Presence of intraocular lens: Secondary | ICD-10-CM | POA: Diagnosis not present

## 2023-08-07 ENCOUNTER — Other Ambulatory Visit: Payer: Self-pay | Admitting: Student

## 2023-08-07 DIAGNOSIS — G4700 Insomnia, unspecified: Secondary | ICD-10-CM

## 2023-08-22 ENCOUNTER — Other Ambulatory Visit: Payer: Self-pay | Admitting: Nurse Practitioner

## 2023-08-25 DIAGNOSIS — D2262 Melanocytic nevi of left upper limb, including shoulder: Secondary | ICD-10-CM | POA: Diagnosis not present

## 2023-08-25 DIAGNOSIS — D2272 Melanocytic nevi of left lower limb, including hip: Secondary | ICD-10-CM | POA: Diagnosis not present

## 2023-08-25 DIAGNOSIS — D2261 Melanocytic nevi of right upper limb, including shoulder: Secondary | ICD-10-CM | POA: Diagnosis not present

## 2023-08-25 DIAGNOSIS — D225 Melanocytic nevi of trunk: Secondary | ICD-10-CM | POA: Diagnosis not present

## 2023-08-25 DIAGNOSIS — L817 Pigmented purpuric dermatosis: Secondary | ICD-10-CM | POA: Diagnosis not present

## 2023-08-25 DIAGNOSIS — Z85828 Personal history of other malignant neoplasm of skin: Secondary | ICD-10-CM | POA: Diagnosis not present

## 2023-10-16 DIAGNOSIS — D0421 Carcinoma in situ of skin of right ear and external auricular canal: Secondary | ICD-10-CM | POA: Diagnosis not present

## 2023-10-16 DIAGNOSIS — D485 Neoplasm of uncertain behavior of skin: Secondary | ICD-10-CM | POA: Diagnosis not present

## 2023-12-02 ENCOUNTER — Encounter: Payer: Medicare Other | Admitting: Family

## 2023-12-11 ENCOUNTER — Encounter: Payer: Self-pay | Admitting: Nurse Practitioner

## 2023-12-11 ENCOUNTER — Ambulatory Visit: Admitting: Nurse Practitioner

## 2023-12-11 VITALS — BP 118/70 | HR 61 | Temp 98.0°F | Ht 69.0 in | Wt 141.8 lb

## 2023-12-11 DIAGNOSIS — Z0000 Encounter for general adult medical examination without abnormal findings: Secondary | ICD-10-CM

## 2023-12-11 NOTE — Patient Instructions (Addendum)
  Jason Mendez , Thank you for taking time to come for your Medicare Wellness Visit. I appreciate your ongoing commitment to your health goals. Please review the following plan we discussed and let me know if I can assist you in the future.     This is a list of the screening recommended for you and due dates:  Health Maintenance  Topic Date Due   Hepatitis B Vaccine (2 of 3 - 19+ 3-dose series) 12/17/1997   COVID-19 Vaccine (9 - 2024-25 season) 08/14/2023   Flu Shot  12/19/2023   Medicare Annual Wellness Visit  12/10/2024   DTaP/Tdap/Td vaccine (2 - Td or Tdap) 05/30/2029   Pneumococcal Vaccine for age over 72  Completed   Zoster (Shingles) Vaccine  Completed   HPV Vaccine  Aged Out   Meningitis B Vaccine  Aged Out

## 2023-12-11 NOTE — Progress Notes (Signed)
 Subjective:   Jason Mendez is a 89 y.o. male who presents for Medicare Annual/Subsequent preventive examination.  Visit Complete: In person TL clinic    Cardiac Risk Factors include: advanced age (>21men, >80 women);male gender;dyslipidemia     Objective:    Today's Vitals   12/11/23 1037  BP: 118/70  Pulse: 61  Temp: 98 F (36.7 C)  SpO2: 99%  Weight: 141 lb 12.8 oz (64.3 kg)  Height: 5' 9 (1.753 m)   Body mass index is 20.94 kg/m.     12/11/2023   10:39 AM 01/01/2023    2:43 PM 11/26/2022    9:48 AM 09/25/2022    1:50 PM  Advanced Directives  Does Patient Have a Medical Advance Directive? Yes Yes Yes Yes  Type of Estate agent of [place];Living will Healthcare Power of [place];Living will Healthcare Power of [place];Living will Healthcare Power of [place];Living will  Does patient want to make changes to medical advance directive? No - Patient declined No - Patient declined No - Patient declined No - Patient declined  Copy of Healthcare Power of Attorney in Chart? No - copy requested Yes - validated most recent copy scanned in chart (See row information) No - copy requested No - copy requested    Current Medications (verified) Outpatient Encounter Medications as of 12/11/2023  Medication Sig   atorvastatin  (LIPITOR) 20 MG tablet TAKE 1 TABLET BY MOUTH DAILY   calcium  carbonate (OSCAL) 1500 (600 Ca) MG TABS tablet Take by mouth daily with breakfast.   cholecalciferol (VITAMIN D3) 25 MCG (1000 UNIT) tablet Take 1,000 Units by mouth 2 (two) times daily.   famotidine  (PEPCID ) 20 MG tablet TAKE 1 TABLET BY MOUTH DAILY   magnesium 30 MG tablet Take 30 mg by mouth at bedtime.   Multiple Vitamin (MULTIVITAMIN) tablet Take 1 tablet by mouth daily.   traZODone  (DESYREL ) 50 MG tablet TAKE 1 TABLET BY MOUTH AT  BEDTIME   [DISCONTINUED] alendronate  (FOSAMAX ) 70 MG tablet Take 1 tablet (70 mg total) by mouth once a week. (Patient not taking: Reported on  12/11/2023)   No facility-administered encounter medications on file as of 12/11/2023.    Allergies (verified) Patient has no known allergies.   History: Past Medical History:  Diagnosis Date   High cholesterol    Past Surgical History:  Procedure Laterality Date   BUNIONECTOMY     Family History  Problem Relation Age of Onset   Rheum arthritis Mother    Osteoarthritis Mother    Stroke Father    Diabetes Father    Hypertension Father    Social History   Socioeconomic History   Marital status: Married    Spouse name: Not on file   Number of children: Not on file   Years of education: Not on file   Highest education level: Not on file  Occupational History   Not on file  Tobacco Use   Smoking status: Former    Current packs/day: 0.00    Types: Cigarettes    Quit date: 05/30/1974    Years since quitting: 49.5   Smokeless tobacco: Never  Vaping Use   Vaping status: Never Used  Substance and Sexual Activity   Alcohol use: Yes    Alcohol/week: 14.0 standard drinks of alcohol    Types: 14 Glasses of wine per week    Comment: 2 drinks daiy wine or scotch   Drug use: Never   Sexual activity: Not on file  Other Topics Concern  Not on file  Social History Narrative   Not on file   Social Drivers of Health   Financial Resource Strain: Not on file  Food Insecurity: No Food Insecurity (12/11/2023)   Hunger Vital Sign    Worried About Running Out of Food in the Last Year: Never true    Ran Out of Food in the Last Year: Never true  Transportation Needs: No Transportation Needs (12/11/2023)   PRAPARE - Administrator, Civil Service (Medical): No    Lack of Transportation (Non-Medical): No  Physical Activity: Not on file  Stress: Not on file  Social Connections: Not on file    Tobacco Counseling Counseling given: Not Answered   Clinical Intake:  Pre-visit preparation completed: Yes  Pain : No/denies pain     BMI - recorded: 20 Nutritional  Status: BMI of 19-24  Normal Nutritional Risks: Unintentional weight loss Diabetes: No  How often do you need to have someone help you when you read instructions, pamphlets, or other written materials from your doctor or pharmacy?: 1 - Never         Activities of Daily Living    12/11/2023   10:48 AM  In your present state of health, do you have any difficulty performing the following activities:  Hearing? 0  Vision? 0  Difficulty concentrating or making decisions? 0  Walking or climbing stairs? 0  Dressing or bathing? 0  Doing errands, shopping? 0  Preparing Food and eating ? N  Using the Toilet? N  In the past six months, have you accidently leaked urine? N  Do you have problems with loss of bowel control? N  Managing your Medications? N  Managing your Finances? N  Housekeeping or managing your Housekeeping? N    Patient Care Team: Abdul Fine, MD as PCP - General (Family Medicine)  Indicate any recent Medical Services you may have received from other than [facility] providers in the past year (date may be approximate).     Assessment:   This is a routine wellness examination for Jason Mendez.  Hearing/Vision screen Vision Screening - Comments:: [facility] Last Exam: 07/2023   Goals Addressed             This Visit's Progress    COMPLETED: Patient Stated       To play 9 holes of golf on his 89th birthday      Patient Stated       To stay healthy enough to take care of his wife       Depression Screen    12/11/2023   10:39 AM 11/26/2022    9:47 AM 09/25/2022    1:50 PM  PHQ 2/9 Scores  PHQ - 2 Score 0 0 0    Fall Risk    12/11/2023   10:39 AM 11/26/2022    9:47 AM 09/25/2022    1:50 PM  Fall Risk   Falls in the past year? 0 0 0  Number falls in past yr: 0 0 0  Injury with Fall? 0 0 0  Risk for fall due to : No Fall Risks No Fall Risks   Follow up Falls evaluation completed Falls evaluation completed     MEDICARE RISK AT HOME: Medicare Risk at  Home Any stairs in or around the home?: Yes If so, are there any without handrails?: No Home free of loose throw rugs in walkways, pet beds, electrical cords, etc?: Yes Adequate lighting in your home to reduce risk of  falls?: Yes Life alert?: No Use of a cane, walker or w/c?: No Grab bars in the bathroom?: Yes Shower chair or bench in shower?: Yes Elevated toilet seat or a handicapped toilet?: Yes  TIMED UP AND GO:  Was the test performed?  No    Cognitive Function:        12/11/2023   10:41 AM  6CIT Screen  What Year? 0 points  What month? 0 points  What time? 0 points  Count back from 20 0 points  Months in reverse 0 points  Repeat phrase 0 points  Total Score 0 points    Immunizations Immunization History  Administered Date(s) Administered   Hepatitis B, ADULT 11/19/1997   Influenza, High Dose Seasonal PF 02/13/2022   Influenza-Unspecified 03/13/2023   Moderna Covid-19 Fall Seasonal Vaccine 23yrs & older 08/27/2022   Moderna Covid-19 Vaccine Bivalent Booster 70yrs & up 02/07/2021, 10/16/2021   Moderna Sars-Covid-2 Vaccination 06/01/2019, 06/29/2019, 09/29/2020, 02/07/2022   PNEUMOCOCCAL CONJUGATE-20 07/05/2021   Pneumococcal Conjugate-13 04/02/2013   Pneumococcal Polysaccharide-23 11/20/1995, 03/29/2015   RSV,unspecified 03/30/2022   Tdap 05/31/2019   Unspecified SARS-COV-2 Vaccination 02/14/2023   Zoster Recombinant(Shingrix) 02/05/2017   Zoster, Unspecified 02/06/2017, 04/09/2017    TDAP status: Up to date  Flu Vaccine status: Up to date  Pneumococcal vaccine status: Up to date  Covid-19 vaccine status: Information provided on how to obtain vaccines.   Qualifies for Shingles Vaccine? Yes   Zostavax completed No   Shingrix Completed?: Yes  Screening Tests Health Maintenance  Topic Date Due   Hepatitis B Vaccines (2 of 3 - 19+ 3-dose series) 12/17/1997   COVID-19 Vaccine (9 - 2024-25 season) 08/14/2023   INFLUENZA VACCINE  12/19/2023   Medicare  Annual Wellness (AWV)  12/10/2024   DTaP/Tdap/Td (2 - Td or Tdap) 05/30/2029   Pneumococcal Vaccine: 50+ Years  Completed   Zoster Vaccines- Shingrix  Completed   HPV VACCINES  Aged Out   Meningococcal B Vaccine  Aged Out    Health Maintenance  Health Maintenance Due  Topic Date Due   Hepatitis B Vaccines (2 of 3 - 19+ 3-dose series) 12/17/1997   COVID-19 Vaccine (9 - 2024-25 season) 08/14/2023    Colorectal cancer screening: No longer required.   Lung Cancer Screening: (Low Dose CT Chest recommended if Age 34-80 years, 20 pack-year currently smoking OR have quit w/in 15years.) does not qualify.   Lung Cancer Screening Referral: na  Additional Screening:  Hepatitis C Screening: does not qualify; Completed   Vision Screening: Recommended annual ophthalmology exams for early detection of glaucoma and other disorders of the eye. Is the patient up to date with their annual eye exam?  Yes  Who is the provider or what is the name of the office in which the patient attends annual eye exams? [facility]  If pt is not established with a provider, would they like to be referred to a provider to establish care? No .   Dental Screening: Recommended annual dental exams for proper oral hygiene   Community Resource Referral / Chronic Care Management: CRR required this visit?  No   CCM required this visit?  No     Plan:     I have personally reviewed and noted the following in the patient's chart:   Medical and social history Use of alcohol, tobacco or illicit drugs  Current medications and supplements including opioid prescriptions. Patient is not currently taking opioid prescriptions. Functional ability and status Nutritional status Physical activity Advanced directives  List of other physicians Hospitalizations, surgeries, and ER visits in previous 12 months Vitals Screenings to include cognitive, depression, and falls Referrals and appointments  In addition, I have  reviewed and discussed with patient certain preventive protocols, quality metrics, and best practice recommendations. A written personalized care plan for preventive services as well as general preventive health recommendations were provided to patient.     Harlene MARLA An, NP   12/11/2023

## 2023-12-17 ENCOUNTER — Other Ambulatory Visit

## 2023-12-27 ENCOUNTER — Other Ambulatory Visit: Payer: Self-pay | Admitting: Student

## 2024-01-16 DIAGNOSIS — D0421 Carcinoma in situ of skin of right ear and external auricular canal: Secondary | ICD-10-CM | POA: Diagnosis not present

## 2024-02-11 ENCOUNTER — Ambulatory Visit (INDEPENDENT_AMBULATORY_CARE_PROVIDER_SITE_OTHER)

## 2024-02-11 ENCOUNTER — Other Ambulatory Visit: Payer: Self-pay

## 2024-02-11 DIAGNOSIS — Z23 Encounter for immunization: Secondary | ICD-10-CM

## 2024-02-11 MED ORDER — COVID-19 MRNA VAC-TRIS(PFIZER) 30 MCG/0.3ML IM SUSY: 0.3000 mL | PREFILLED_SYRINGE | Freq: Once | INTRAMUSCULAR | 0 refills | Status: AC

## 2024-02-11 NOTE — Progress Notes (Signed)
 Patient came with wife to appointment and requested Covid vaccine be sent into pharmacy and also requested Flu vaccine be completed today as well.

## 2024-02-29 ENCOUNTER — Other Ambulatory Visit: Payer: Self-pay | Admitting: Student

## 2024-03-23 ENCOUNTER — Other Ambulatory Visit: Payer: Self-pay

## 2024-03-23 DIAGNOSIS — G4700 Insomnia, unspecified: Secondary | ICD-10-CM

## 2024-03-23 MED ORDER — TRAZODONE HCL 50 MG PO TABS: 50.0000 mg | ORAL_TABLET | Freq: Every day | ORAL | 1 refills | Status: DC

## 2024-04-28 ENCOUNTER — Non-Acute Institutional Stay: Admitting: Orthopedic Surgery

## 2024-04-28 ENCOUNTER — Encounter: Payer: Self-pay | Admitting: Orthopedic Surgery

## 2024-04-28 VITALS — BP 130/68 | HR 81 | Temp 96.9°F | Ht 69.0 in | Wt 144.0 lb

## 2024-04-28 DIAGNOSIS — J069 Acute upper respiratory infection, unspecified: Secondary | ICD-10-CM | POA: Diagnosis not present

## 2024-04-28 NOTE — Progress Notes (Signed)
 Location:  Other Nursing Home Room Number: [facility] Service:  Clinic (12) Provider:  Greig FORBES Cluster, NP   Caro Harlene POUR, NP  Patient Care Team: Caro Harlene POUR, NP as PCP - General (Geriatric Medicine)  Extended Emergency Contact Information Primary Emergency Contact: Tindel,Faye Address: [address]          [place], [place] [address] [place] Home Phone: [phone] Relation: Spouse  Code Status:  Full code Goals of care: Advanced Directive information    12/11/2023   10:39 AM  Advanced Directives  Does Patient Have a Medical Advance Directive? Yes  Type of Estate Agent of [place];Living will  Does patient want to make changes to medical advance directive? No - Patient declined  Copy of Healthcare Power of Attorney in Chart? No - copy requested     Chief Complaint  Patient presents with   URI    URI. Complains of Sore Throat, Cough and runny nose.  Covid/Flu test done in office: Negative for Both    HPI:  Pt is a 89 y.o. male seen today for acute visit due to cold symptoms.   Discussed the use of AI scribe software for clinical note transcription with the patient, who gave verbal consent to proceed.  History of Present Illness   Jason Mendez is a 89 year old male who presents with upper respiratory symptoms.  He began feeling ill late Monday afternoon after attending an outdoor event on Saturday night where he was exposed to cold weather for two hours. Initial symptoms included rhinorrhea and a scratchy throat. Sore throat has subsided. Nasal drainage clear. Denies dental or facial pain. He experiences a deep, non-productive cough that is irritating but does not affect his sleep. No fever, chills, ear pain, or chest pain. He denies shortness of breath or wheezing and has not experienced any pain in his teeth or face.  He has not been taking any regular medications for these symptoms but took one Tessalon  Perle from his wife's supply, which he believes helped. He continues to take vitamin C, 1000 mg daily, as part of his routine.        Past Medical History:  Diagnosis Date   High cholesterol    Past Surgical History:  Procedure Laterality Date   BUNIONECTOMY      No Known Allergies  Outpatient Encounter Medications as of 04/28/2024  Medication Sig   atorvastatin  (LIPITOR) 20 MG tablet TAKE 1 TABLET BY MOUTH DAILY   calcium  carbonate (OSCAL) 1500 (600 Ca) MG TABS tablet Take by mouth daily with breakfast.   cholecalciferol (VITAMIN D3) 25 MCG (1000 UNIT) tablet Take 1,000 Units by mouth 2 (two) times daily.   famotidine  (PEPCID ) 20 MG tablet TAKE 1 TABLET BY MOUTH DAILY   magnesium 30 MG tablet Take 30 mg by mouth at bedtime.   Multiple Vitamin (MULTIVITAMIN) tablet Take 1 tablet by mouth daily.   traZODone  (DESYREL ) 50 MG tablet Take 1 tablet (50 mg total) by mouth at bedtime.   No facility-administered encounter medications on file as of 04/28/2024.    Review of Systems  Constitutional:  Positive for chills. Negative for fatigue and fever.  HENT:  Positive for congestion, rhinorrhea and sore throat. Negative for ear pain, sinus pain and trouble swallowing.   Eyes:  Negative for visual disturbance.  Respiratory:  Positive for cough. Negative for shortness of breath and wheezing.   Cardiovascular:  Negative for chest pain and  leg swelling.  Gastrointestinal:  Negative for nausea and vomiting.  Genitourinary:  Negative for dysuria.  Musculoskeletal:  Negative for myalgias.  Neurological:  Negative for dizziness and headaches.  Psychiatric/Behavioral:  Negative for dysphoric mood. The patient is not nervous/anxious.     Immunization History  Administered Date(s) Administered   Hepatitis B, ADULT 11/19/1997   INFLUENZA, HIGH DOSE SEASONAL PF 02/13/2022, 02/11/2024   Influenza-Unspecified 03/13/2023   Moderna Covid-19 Fall Seasonal Vaccine 68yrs & older 08/27/2022   Moderna  Covid-19 Vaccine Bivalent Booster 46yrs & up 02/07/2021, 10/16/2021   Moderna Sars-Covid-2 Vaccination 06/01/2019, 06/29/2019, 09/29/2020, 02/07/2022   PNEUMOCOCCAL CONJUGATE-20 07/05/2021   Pneumococcal Conjugate-13 04/02/2013   Pneumococcal Polysaccharide-23 11/20/1995, 03/29/2015   RSV,unspecified 03/30/2022   Tdap 05/31/2019   Unspecified SARS-COV-2 Vaccination 02/14/2023, 02/24/2024   Zoster Recombinant(Shingrix) 02/05/2017   Zoster, Unspecified 02/06/2017, 04/09/2017   Pertinent  Health Maintenance Due  Topic Date Due   Influenza Vaccine  Completed      05/31/2019    5:35 PM 09/25/2022    1:50 PM 11/26/2022    9:47 AM 12/11/2023   10:39 AM  Fall Risk  Falls in the past year?  0 0 0  Was there an injury with Fall?  0  0  0   Fall Risk Category Calculator  0 0 0  (RETIRED) Patient Fall Risk Level Low fall risk      Patient at Risk for Falls Due to   No Fall Risks No Fall Risks  Fall risk Follow up   Falls evaluation completed Falls evaluation completed     Data saved with a previous flowsheet row definition   Functional Status Survey:    Vitals:   04/28/24 1456  BP: 130/68  Pulse: 81  Temp: (!) 96.9 F (36.1 C)  SpO2: 98%  Weight: 144 lb (65.3 kg)  Height: 5' 9 (1.753 m)   Body mass index is 21.27 kg/m. Physical Exam Vitals reviewed.  Constitutional:      General: He is not in acute distress.    Appearance: He is not ill-appearing.  HENT:     Head: Normocephalic.     Right Ear: Tympanic membrane normal.     Left Ear: Tympanic membrane normal.     Nose: Rhinorrhea present.     Right Turbinates: Enlarged. Not swollen.     Left Turbinates: Enlarged. Not swollen.     Mouth/Throat:     Mouth: Mucous membranes are moist.     Pharynx: No posterior oropharyngeal erythema.  Eyes:     General:        Right eye: No discharge.        Left eye: No discharge.  Cardiovascular:     Rate and Rhythm: Normal rate and regular rhythm.     Pulses: Normal pulses.     Heart  sounds: Normal heart sounds.  Pulmonary:     Effort: Pulmonary effort is normal. No respiratory distress.     Breath sounds: Normal breath sounds. No wheezing, rhonchi or rales.  Abdominal:     General: Bowel sounds are normal.     Palpations: Abdomen is soft.  Musculoskeletal:     Cervical back: Neck supple.     Right lower leg: No edema.     Left lower leg: No edema.  Lymphadenopathy:     Cervical: No cervical adenopathy.  Skin:    General: Skin is warm.     Capillary Refill: Capillary refill takes less than 2 seconds.  Neurological:  General: No focal deficit present.     Mental Status: He is alert and oriented to person, place, and time.  Psychiatric:        Mood and Affect: Mood normal.     Labs reviewed: No results for input(s): NA, K, CL, CO2, GLUCOSE, BUN, CREATININE, CALCIUM , MG, PHOS in the last 8760 hours. No results for input(s): AST, ALT, ALKPHOS, BILITOT, PROT, ALBUMIN in the last 8760 hours. No results for input(s): WBC, NEUTROABS, HGB, HCT, MCV, PLT in the last 8760 hours. No results found for: TSH Lab Results  Component Value Date   HGBA1C 5.4 09/26/2022   Lab Results  Component Value Date   CHOL 185 09/26/2022   HDL 79 09/26/2022   LDLCALC 90 09/26/2022   TRIG 73 09/26/2022   CHOLHDL 2.3 09/26/2022    Significant Diagnostic Results in last 30 days:  No results found.  Assessment/Plan 1. Viral upper respiratory illness (Primary) - onset 12/08> was outside in cold x 2 hours 12/06 - symptoms: rhinorrhea, sore throat, dry cough, malaise - in house flu/covid> negative - lung sounds clear, nasal turbinates enlarged, afebrile - start Zyrtec at bedtime x 10 days for nasal secretions - may use tessalon pearls and levalbuterol prn for cough/wheezing  - cont vitamin C 1000 mg daily - recommend zinc 50 mg daily x 7 days  - if no improvement by next week> needs follow up visit     Family/ staff  Communication: plan discussed with patient   Labs/tests ordered: none

## 2024-04-28 NOTE — Patient Instructions (Signed)
 Recommend Vitamin C 1000 mg daily x 7 days  Recommend Zinc 50 mg daily x 7 days  Zyrtec 10 mg every night x 7 days   You may use tessalon pearls OR Levalbuterol nebulizer treatment for cough/wheezing   If no improvement > schedule to be seen next week

## 2024-06-01 ENCOUNTER — Encounter: Payer: Self-pay | Admitting: Internal Medicine

## 2024-06-01 DIAGNOSIS — M76899 Other specified enthesopathies of unspecified lower limb, excluding foot: Secondary | ICD-10-CM | POA: Insufficient documentation

## 2024-06-01 DIAGNOSIS — R233 Spontaneous ecchymoses: Secondary | ICD-10-CM | POA: Insufficient documentation

## 2024-06-01 DIAGNOSIS — S335XXA Sprain of ligaments of lumbar spine, initial encounter: Secondary | ICD-10-CM | POA: Insufficient documentation

## 2024-06-01 DIAGNOSIS — S01309A Unspecified open wound of unspecified ear, initial encounter: Secondary | ICD-10-CM | POA: Insufficient documentation

## 2024-06-02 ENCOUNTER — Non-Acute Institutional Stay: Admitting: Internal Medicine

## 2024-06-02 ENCOUNTER — Encounter: Payer: Self-pay | Admitting: Internal Medicine

## 2024-06-02 VITALS — BP 134/82 | HR 70 | Temp 97.6°F | Ht 69.0 in | Wt 145.0 lb

## 2024-06-02 DIAGNOSIS — Z66 Do not resuscitate: Secondary | ICD-10-CM | POA: Diagnosis not present

## 2024-06-02 DIAGNOSIS — F4321 Adjustment disorder with depressed mood: Secondary | ICD-10-CM

## 2024-06-02 DIAGNOSIS — F5101 Primary insomnia: Secondary | ICD-10-CM | POA: Diagnosis not present

## 2024-06-02 DIAGNOSIS — E782 Mixed hyperlipidemia: Secondary | ICD-10-CM

## 2024-06-02 DIAGNOSIS — R5381 Other malaise: Secondary | ICD-10-CM | POA: Diagnosis not present

## 2024-06-02 DIAGNOSIS — I7300 Raynaud's syndrome without gangrene: Secondary | ICD-10-CM

## 2024-06-02 MED ORDER — MELATONIN 5 MG PO TABS: 5.0000 mg | ORAL_TABLET | Freq: Every evening | ORAL | Status: AC | PRN

## 2024-06-02 NOTE — Assessment & Plan Note (Addendum)
 Referral to Jason Mendez with [facility] to get fitness evaluation and he him plugged into exercise classes.

## 2024-06-02 NOTE — Addendum Note (Signed)
 Addended by: LAURENCE, Clois Montavon on: 06/02/2024 08:19 PM   Modules accepted: Orders

## 2024-06-02 NOTE — Assessment & Plan Note (Addendum)
 Pt still grieving over the death of his wife in 04-23-24. Pt was his wife's primary caretaker. Pt was enrolled into hospice care. Pt still trying to get his wife's celebration of life memorial service put together for next month. I think he needs some socialization as he was using all of his time care for his wife the last 3-4 years.  Now that she is passed away, I think he is having trouble finding what he needs to do with all the time he has on his hands.  Will refer him to Lorn Ada with [facility] to get him enrolled into fitness classes and so he can socialize more.

## 2024-06-02 NOTE — Assessment & Plan Note (Addendum)
 Of his fingers when they get cold. Discoloration improves with warming of his hands in warm water.

## 2024-06-02 NOTE — Progress Notes (Addendum)
 [facility] Outpatient Progress Note      Careteam: Patient Care Team: Caro Harlene POUR, NP as PCP - General (Geriatric Medicine) PLACE OF SERVICE: [facility]   Advanced Directive information Does Patient Have a Medical Advance Directive?: Yes, Type of Advance Directive: Healthcare Power of [place];Living will, Does patient want to make changes to medical advance directive?: No - Patient declined   Allergies[1]   Chief Complaint  Patient presents with   Discuss Comfort Care    Patient wants to Discuss Comfort Care. Complains of Insomnia, Congestion, Right Ankle soreness and Fingers turning blue     HPI: Patient is a 89 y.o. male seen in today in [facility] outpatient clinic.  Discussed the use of AI scribe software for clinical note transcription with the patient, who gave verbal consent to proceed.  History of Present Illness   Jason Mendez is a 89 year old male who presents with concerns about comfort care and multiple health issues.  Upper respiratory symptoms - Severe cold began December 10th, lasting approximately 72 hours - COVID-19 and influenza tests negative - Significant fatigue and malaise during illness, without fever - Currently has morning mucus production, occasionally dark or blood-tinged - Persistent rhinorrhea throughout the day - No wheezing - Mild exertional dyspnea, such as when walking quickly  Lower extremity edema and erythema - Right ankle swelling began 5-6 days ago - Tightness and redness present for approximately one year - Redness managed with Ace bandage and tissue to prevent irritation  Peripheral cyanosis and paresthesia - Bluish discoloration and tingling in hands when exposed to cold - Symptoms resolve with warming - Episodes occur spontaneously indoors, not necessarily with handling cold objects  Sleep disturbance - Difficulty sleeping since wife's death on 2025-04-19- Initially slept well due to exhaustion, now  wakes at 1:30-2:30 AM and unable to return to sleep - Trazodone  50 mg taken, found ineffective - History of sleep disruption while caring for wife with spinal stenosis and Alzheimer's disease  Decreased physical activity and fatigue - Reduced exercise while caring for wife - Since January 1st, increasing physical activity by walking on indoor track - Gradually increasing distance walked - Desires to regain physical fitness and energy levels         Review of Systems: Review of Systems  Constitutional:  Positive for malaise/fatigue.  HENT: Negative.    Eyes: Negative.   Respiratory: Negative.    Cardiovascular: Negative.   Gastrointestinal: Negative.   Genitourinary: Negative.        Denies any nocturia  Musculoskeletal: Negative.   Skin: Negative.   Neurological:  Positive for weakness.  Endo/Heme/Allergies: Negative.   Psychiatric/Behavioral:  Positive for depression. The patient has insomnia.        Having difficulty staying asleep. Wakes up at 2-3 AM. Can't go back to sleep. Mind is racing with thoughts.  Feels like he has grieved for the loss of his wife. He spent 2-3 years taking care of her in their home.  All other systems reviewed and are negative.    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 His smoking use included cigarettes. He has never used smokeless tobacco. He reports current alcohol use of about 14.0 standard drinks of alcohol per  week. He reports that he does not use drugs.   Family History  Problem Relation Age of Onset   Rheum arthritis Mother    Osteoarthritis Mother    Stroke Father    Diabetes Father    Hypertension Father      Medications: Patient's Medications  New Prescriptions   MELATONIN 5 MG TABS    Take 1 tablet (5 mg total) by mouth at bedtime as needed (sleep).  Previous Medications   ATORVASTATIN  (LIPITOR) 20 MG TABLET    TAKE 1 TABLET BY MOUTH DAILY   CALCIUM  CARBONATE (OSCAL) 1500 (600 CA) MG TABS TABLET    Take by mouth daily with breakfast.   CHOLECALCIFEROL (VITAMIN D3) 25 MCG (1000 UNIT) TABLET    Take 1,000 Units by mouth 2 (two) times daily.   FAMOTIDINE  (PEPCID ) 20 MG TABLET    TAKE 1 TABLET BY MOUTH DAILY   MAGNESIUM 30 MG TABLET    Take 30 mg by mouth at bedtime.   MULTIPLE VITAMIN (MULTIVITAMIN) TABLET    Take 1 tablet by mouth daily.  Modified Medications   No medications on file  Discontinued Medications   TRAZODONE  (DESYREL ) 50 MG TABLET    Take 1 tablet (50 mg total) by mouth at bedtime.     Physical Exam:   Vitals:   06/02/24 1420  BP: 134/82  Pulse: 70  Temp: 97.6 F (36.4 C)  SpO2: 97%  Weight: 145 lb (65.8 kg)  Height: 5' 9 (1.753 m)   Body mass index is 21.41 kg/m. Wt Readings from Last 3 Encounters:  06/02/24 145 lb (65.8 kg)  04/28/24 144 lb (65.3 kg)  12/11/23 141 lb 12.8 oz (64.3 kg)     Physical Exam Vitals and nursing note reviewed.  Constitutional:      General: He is not in acute distress.    Appearance: He is normal weight. He is not toxic-appearing.     Comments: Appears younger than stated age of 16  HENT:     Head: Normocephalic and atraumatic.     Nose: Nose normal.  Eyes:     General: No scleral icterus. Cardiovascular:     Rate and Rhythm: Normal rate and regular rhythm.  Pulmonary:     Effort: Pulmonary effort is normal.     Breath sounds: Normal breath sounds.  Abdominal:     General: Abdomen is flat. Bowel sounds are  normal.     Palpations: Abdomen is soft.  Musculoskeletal:     Comments: Trace right lower leg edema. Worse when he stands all day. Better in the AM when he first gets up.  Skin:    General: Skin is warm and dry.     Capillary Refill: Capillary refill takes less than 2 seconds.  Neurological:     Mental Status: He is alert and oriented to person, place, and time.    Physical Exam   VITALS: BP- 134/82 EXTREMITIES: Callus on the outside part of the right ankle. Swelling on the right ankle.       Labs reviewed: Basic Metabolic Panel:  J8R: Lab Results  Component Value Date   HGBA1C 5.4 09/26/2022  Assessment/Plan Assessment and Plan    Insomnia Chronic insomnia with difficulty maintaining sleep, exacerbated by recent bereavement and stress. Trazodone  50 mg is ineffective and not preferred due to potential side effects in men, including priapism. Melatonin is suggested as a natural alternative. - Start melatonin 5 mg at bedtime, increase to 10 mg if needed. - Encouraged regular exercise to improve sleep quality. - Referred to [facility] for fitness assessment and exercise program.  Deconditioning Due to reduced physical activity following caregiving responsibilities. Currently engaging in walking program since January 1st, with gradual increase in laps. Needs further assessment and structured exercise program to improve overall fitness and social engagement. - Referred to [facility] for fitness assessment and exercise program. - Encouraged participation in social and exercise classes to improve physical and mental health.  Chronic venous insufficiency with lower extremity edema and ankle callus Chronic venous insufficiency with lower extremity edema, particularly in the right ankle, worsening throughout the day. Presence of callus due to chronic rubbing against sheets. No pain or recent trauma reported. - Recommended use of compression stockings to reduce edema. - Advised application  of hydrocortisone cream to reduce callus irritation.  Raynaud's phenomenon Intermittent Raynaud's phenomenon with bluish discoloration and tingling in hands when cold. Symptoms improve with warming. Risk of frostbite if not managed properly. - Advised wearing gloves in cold weather to prevent vasospasm.  General Health Maintenance Discussion of comfort care and do not resuscitate (DNR) preferences. No current need for comfort care as he is not terminally ill. DNR form provided for emergency situations. - Provided DNR form for emergency situations.      Assessment & Plan Mixed hyperlipidemia  Orders:   CBC With Differential/Platelet   Comprehensive metabolic panel with GFR   Lipid panel   TSH + free T4  DNR (do not resuscitate)/DNI(Do Not Intubate) Discussed DNR/DNI status. Pt agrees to DNR/DNI status. DNR form completed. Uploaded to [employer].  Orders:   Do not attempt resuscitation (DNR)  Physical deconditioning Referral to Lorn Ada with [facility] to get fitness evaluation and he him plugged into exercise classes.     Grieving Pt still grieving over the death of his wife in 08-Apr-2024. Pt was his wife's primary caretaker. Pt was enrolled into hospice care. Pt still trying to get his wife's celebration of life memorial service put together for next month. I think he needs some socialization as he was using all of his time care for his wife the last 3-4 years.  Now that she is passed away, I think he is having trouble finding what he needs to do with all the time he has on his hands.  Will refer him to Lorn Ada with [facility] to get him enrolled into fitness classes and so he can socialize more.     Raynaud's disease without gangrene Of his fingers when they get cold. Discoloration improves with warming of his hands in warm water.     Primary insomnia Start melatonin 5 mg at bedtime. Stop trazodone . Risk for priapism       Meds ordered this encounter   Medications   melatonin 5 MG TABS    Sig: Take 1 tablet (5 mg total) by mouth at bedtime as needed (sleep).   Medications Discontinued During This Encounter  Medication Reason   traZODone  (DESYREL ) 50 MG tablet Discontinued by provider   Orders Placed This Encounter  Procedures   CBC With Differential/Platelet   Comprehensive metabolic panel with GFR   Lipid  panel   TSH + free T4   Do not attempt resuscitation (DNR)    If patient has no pulse and is not breathing:   Do Not Attempt Resuscitation    If patient has a pulse and/or is breathing: Medical Treatment Goals:   LIMITED ADDITIONAL INTERVENTIONS: Use medication/IV fluids and cardiac monitoring as indicated; Do not use intubation or mechanical ventilation (DNI), also provide comfort medications.  Transfer to Progressive/Stepdown as indicated, avoid Intensive Care.    Consent::   Discussion documented in EHR or advanced directives reviewed    Next appt: 3 months with Harlene or Amy to see how is progressing with physical exercise with [facility] fitness classes. To see if he is able to walk more than 8 laps around the indoor track.  Camellia Door, DO [facility] [phone]     [1] No Known Allergies

## 2024-06-02 NOTE — Assessment & Plan Note (Addendum)
" °  Orders:   CBC With Differential/Platelet   Comprehensive metabolic panel with GFR   Lipid panel   TSH + free T4  "

## 2024-06-02 NOTE — Assessment & Plan Note (Addendum)
 Discussed DNR/DNI status. Pt agrees to DNR/DNI status. DNR form completed. Uploaded to [employer].  Orders:   Do not attempt resuscitation (DNR)

## 2024-06-09 ENCOUNTER — Non-Acute Institutional Stay: Admitting: Orthopedic Surgery

## 2024-06-09 ENCOUNTER — Encounter: Payer: Self-pay | Admitting: Orthopedic Surgery

## 2024-06-09 VITALS — BP 124/68 | HR 64 | Temp 98.1°F | Ht 69.0 in | Wt 145.0 lb

## 2024-06-09 DIAGNOSIS — M25532 Pain in left wrist: Secondary | ICD-10-CM | POA: Diagnosis not present

## 2024-06-09 DIAGNOSIS — S61512A Laceration without foreign body of left wrist, initial encounter: Secondary | ICD-10-CM

## 2024-06-09 DIAGNOSIS — W19XXXA Unspecified fall, initial encounter: Secondary | ICD-10-CM | POA: Diagnosis not present

## 2024-06-09 NOTE — Patient Instructions (Addendum)
 Please contact provider if any increased pain, redness, swelling or pus drainage  Have Janci change dressing twice weekly  If wrist pain worsens recommend xray> call provider   Do not get left hand wet> it will be painful

## 2024-06-09 NOTE — Progress Notes (Signed)
 " Location:  Other Nursing Home Room Number: [facility] Clinic Place of Service:  Clinic (12) Provider:  Greig FORBES Cluster, NP   Caro Harlene POUR, NP  Patient Care Team: Caro Harlene POUR, NP as PCP - General (Geriatric Medicine)  Extended Emergency Contact Information Primary Emergency Contact: Meador,Faye Address: [address]          [place], [place] [address] [place] Home Phone: [phone] Relation: Spouse  Code Status:  Full code Goals of care: Advanced Directive information    06/02/2024    2:23 PM  Advanced Directives  Does Patient Have a Medical Advance Directive? Yes  Type of Estate Agent of [place];Living will  Does patient want to make changes to medical advance directive? No - Patient declined  Copy of Healthcare Power of Attorney in Chart? No - copy requested     Chief Complaint  Patient presents with   Jason Mendez Yesterday at a store over a parking concrete stop. Hit Left Eye and Knee and cut Left Hand. EMT checked out yesterday and told him to follow up today.     HPI:  Pt is a 89 y.o. male seen today for acute visit due to fall with left wrist injury.   Discussed the use of AI scribe software for clinical note transcription with the patient, who gave verbal consent to proceed.  History of Present Illness   Jason Mendez is a 89 year old male who presents after a fall.  He experienced a fall yesterday in front of a store he frequently visits, tripping over a barrier intended to prevent vehicles from driving too far. No dizziness or uneven pavement contributed to the fall.  During the fall, he used his hands to brace himself, resulting in a cut on his left wrist. He is not on blood thinners. The patient reported that the EMT said the cut was not deep enough to require stitches. Denies pain to left wrist.   He also mentioned hitting his knee during the fall, which is mildly swollen but not bruised. No other injuries or  significant pain are reported. He denies pain to left knee.        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   Enthesopathy of hip region 06/01/2024   High cholesterol    History of adenomatous polyp of colon 01/01/2023   History of urinary stone 01/01/2023   Lumbar sprain 06/01/2024   Open wound of ear without complication 06/01/2024   Reduced libido 01/01/2023   Spermatocele of epididymis, multiple 01/01/2023   Past Surgical History:  Procedure Laterality Date   BUNIONECTOMY      Allergies[1]  Outpatient Encounter Medications as of 06/09/2024  Medication Sig   atorvastatin  (LIPITOR) 20 MG tablet TAKE 1 TABLET BY MOUTH DAILY   calcium  carbonate (OSCAL) 1500 (600 Ca) MG TABS tablet Take by mouth daily with breakfast.   cholecalciferol (VITAMIN D3) 25 MCG (1000 UNIT) tablet Take 1,000 Units by mouth 2 (two) times daily.   famotidine  (PEPCID ) 20 MG tablet TAKE 1 TABLET BY MOUTH DAILY   magnesium 30 MG tablet Take 30 mg by mouth at bedtime.   melatonin 5 MG TABS Take 1 tablet (5 mg total) by mouth at bedtime as needed (sleep).   Multiple Vitamin (MULTIVITAMIN) tablet Take 1 tablet by mouth daily.   No facility-administered encounter medications on file as of  06/09/2024.    Review of Systems  Constitutional:  Negative for fatigue and fever.  Respiratory: Negative.    Cardiovascular: Negative.   Musculoskeletal:  Positive for gait problem.  Skin:  Positive for wound.  Psychiatric/Behavioral: Negative.      Immunization History  Administered Date(s) Administered   Hepatitis B, ADULT 11/19/1997   INFLUENZA, HIGH DOSE SEASONAL PF 02/13/2022, 02/11/2024   Influenza-Unspecified 03/13/2023   Moderna Covid-19 Fall Seasonal Vaccine 106yrs & older 08/27/2022   Moderna Covid-19 Vaccine Bivalent Booster 15yrs & up 02/07/2021, 10/16/2021   Moderna Sars-Covid-2 Vaccination 06/01/2019, 06/29/2019,  09/29/2020, 02/07/2022   PNEUMOCOCCAL CONJUGATE-20 07/05/2021   Pneumococcal Conjugate-13 04/02/2013   Pneumococcal Polysaccharide-23 11/20/1995, 03/29/2015   RSV,unspecified 03/30/2022   Tdap 05/31/2019   Unspecified SARS-COV-2 Vaccination 02/14/2023, 02/24/2024   Zoster Recombinant(Shingrix) 02/05/2017   Zoster, Unspecified 02/06/2017, 04/09/2017   Pertinent  Health Maintenance Due  Topic Date Due   Influenza Vaccine  Completed      11/26/2022    9:47 AM 12/11/2023   10:39 AM 04/28/2024    4:39 PM 06/02/2024    2:23 PM 06/09/2024    1:52 PM  Fall Risk  Falls in the past year? 0 0 1 0 1  Was there an injury with Fall? 0  0  0 0 1  Fall Risk Category Calculator 0 0 1 0 3  Patient at Risk for Falls Due to No Fall Risks No Fall Risks History of fall(s) No Fall Risks Impaired balance/gait  Fall risk Follow up Falls evaluation completed Falls evaluation completed Falls evaluation completed Falls evaluation completed Falls evaluation completed     Data saved with a previous flowsheet row definition   Functional Status Survey:    Vitals:   06/09/24 1348  BP: 124/68  Pulse: 64  Temp: 98.1 F (36.7 C)  SpO2: 96%  Weight: 145 lb (65.8 kg)  Height: 5' 9 (1.753 m)   Body mass index is 21.41 kg/m. Physical Exam Vitals reviewed.  Constitutional:      General: He is not in acute distress. HENT:     Head: Normocephalic.  Eyes:     General:        Right eye: No discharge.        Left eye: No discharge.  Cardiovascular:     Rate and Rhythm: Normal rate and regular rhythm.     Pulses: Normal pulses.     Heart sounds: Normal heart sounds.  Pulmonary:     Effort: Pulmonary effort is normal.     Breath sounds: Normal breath sounds.  Musculoskeletal:     Left wrist: No swelling, snuff box tenderness or crepitus. Normal range of motion. Normal pulse.     Left knee: Swelling present. No deformity or crepitus. Normal range of motion. No tenderness.     Comments: Left wrist with  bruising to palmar side, no skin breakdown, hand grip 4/5  Skin:    General: Skin is warm.     Capillary Refill: Capillary refill takes less than 2 seconds.     Comments: Skin tear to left palm under 5th digit, no infection   Neurological:     General: No focal deficit present.     Mental Status: He is alert and oriented to person, place, and time.     Gait: Gait normal.  Psychiatric:        Mood and Affect: Mood normal.     Labs reviewed: No results for input(s): NA, K, CL, CO2, GLUCOSE, BUN,  CREATININE, CALCIUM , MG, PHOS in the last 8760 hours. No results for input(s): AST, ALT, ALKPHOS, BILITOT, PROT, ALBUMIN in the last 8760 hours. No results for input(s): WBC, NEUTROABS, HGB, HCT, MCV, PLT in the last 8760 hours. No results found for: TSH Lab Results  Component Value Date   HGBA1C 5.4 09/26/2022   Lab Results  Component Value Date   CHOL 185 09/26/2022   HDL 79 09/26/2022   LDLCALC 90 09/26/2022   TRIG 73 09/26/2022   CHOLHDL 2.3 09/26/2022    Significant Diagnostic Results in last 30 days:  No results found.  Assessment/Plan 1. Tear of skin of left wrist, initial encounter (Primary) - fall 01/20 - wound to left palm under 5th digit  - recommend covering with petroleum gauze and covering with nonadherent gauze  - recommend wound nurse visits 2 times weekly   2. Acute pain of left wrist - see above  - no snuff box tenderness, FROM  - denies pain - if symptoms worsens recommend xray  3. Fall, initial encounter - see above - gait normal today - recommend PT evaluation if any other episodes occur     Family/ staff Communication: plan discussed with patient   Labs/tests ordered:   IL wound visits at TL twice weekly        [1] No Known Allergies  "

## 2024-06-09 NOTE — Progress Notes (Signed)
 4

## 2024-06-25 ENCOUNTER — Encounter: Payer: Self-pay | Admitting: Pharmacist

## 2024-06-25 NOTE — Progress Notes (Signed)
" ° °  06/25/2024 Name: Jason Mendez MRN: [id number] DOB: 11-27-1930  Chief Complaint  Patient presents with   Medication Adherence    Atorvastatin   Pharmacy Quality Measure Review  This patient is appearing on a report for being at risk of failing the adherence measure for cholesterol (statin) medications this calendar year.   Medication: Atorvastatin  20 mg  Last fill date: 05/11/2024  for 100  day supply  Insurance report was not up to date. No action needed at this time.   [name], PharmD, BCACP Clinical Pharmacist [phone]   "

## 2024-08-31 ENCOUNTER — Encounter: Admitting: Nurse Practitioner

## 2024-12-13 ENCOUNTER — Ambulatory Visit: Admitting: Family

## 2024-12-14 ENCOUNTER — Ambulatory Visit: Payer: Self-pay | Admitting: Nurse Practitioner
